# Patient Record
Sex: Female | Born: 1975 | Race: Black or African American | Hispanic: No | Marital: Single | Smoking: Former smoker
Health system: Southern US, Community
[De-identification: ages and names within clinical notes are randomized; demographics above are authoritative.]

## PROBLEM LIST (undated history)

## (undated) DIAGNOSIS — N939 Abnormal uterine and vaginal bleeding, unspecified: Secondary | ICD-10-CM

## (undated) DIAGNOSIS — D219 Benign neoplasm of connective and other soft tissue, unspecified: Secondary | ICD-10-CM

## (undated) DIAGNOSIS — R519 Headache, unspecified: Secondary | ICD-10-CM

## (undated) DIAGNOSIS — A64 Unspecified sexually transmitted disease: Secondary | ICD-10-CM

## (undated) DIAGNOSIS — R51 Headache: Secondary | ICD-10-CM

## (undated) DIAGNOSIS — K5190 Ulcerative colitis, unspecified, without complications: Secondary | ICD-10-CM

## (undated) DIAGNOSIS — I10 Essential (primary) hypertension: Secondary | ICD-10-CM

## (undated) DIAGNOSIS — D649 Anemia, unspecified: Secondary | ICD-10-CM

## (undated) HISTORY — PX: WISDOM TOOTH EXTRACTION: SHX21

## (undated) HISTORY — DX: Benign neoplasm of connective and other soft tissue, unspecified: D21.9

## (undated) HISTORY — DX: Abnormal uterine and vaginal bleeding, unspecified: N93.9

## (undated) HISTORY — DX: Essential (primary) hypertension: I10

## (undated) HISTORY — PX: ABDOMINAL HYSTERECTOMY: SHX81

## (undated) HISTORY — PX: TONSILLECTOMY: SUR1361

## (undated) HISTORY — DX: Unspecified sexually transmitted disease: A64

---

## 1997-12-14 ENCOUNTER — Emergency Department (HOSPITAL_COMMUNITY): Admission: EM | Admit: 1997-12-14 | Discharge: 1997-12-15 | Payer: Self-pay | Admitting: Family Medicine

## 1998-12-15 ENCOUNTER — Inpatient Hospital Stay (HOSPITAL_COMMUNITY): Admission: AD | Admit: 1998-12-15 | Discharge: 1998-12-15 | Payer: Self-pay | Admitting: *Deleted

## 1998-12-22 ENCOUNTER — Inpatient Hospital Stay (HOSPITAL_COMMUNITY): Admission: AD | Admit: 1998-12-22 | Discharge: 1998-12-22 | Payer: Self-pay | Admitting: *Deleted

## 1998-12-29 ENCOUNTER — Inpatient Hospital Stay (HOSPITAL_COMMUNITY): Admission: AD | Admit: 1998-12-29 | Discharge: 1998-12-29 | Payer: Self-pay | Admitting: *Deleted

## 1999-01-13 ENCOUNTER — Ambulatory Visit (HOSPITAL_COMMUNITY): Admission: RE | Admit: 1999-01-13 | Discharge: 1999-01-13 | Payer: Self-pay | Admitting: *Deleted

## 1999-01-13 ENCOUNTER — Encounter: Payer: Self-pay | Admitting: *Deleted

## 1999-04-04 ENCOUNTER — Inpatient Hospital Stay (HOSPITAL_COMMUNITY): Admission: AD | Admit: 1999-04-04 | Discharge: 1999-04-04 | Payer: Self-pay | Admitting: *Deleted

## 1999-05-24 ENCOUNTER — Inpatient Hospital Stay (HOSPITAL_COMMUNITY): Admission: AD | Admit: 1999-05-24 | Discharge: 1999-05-26 | Payer: Self-pay | Admitting: *Deleted

## 1999-09-10 ENCOUNTER — Emergency Department (HOSPITAL_COMMUNITY): Admission: EM | Admit: 1999-09-10 | Discharge: 1999-09-10 | Payer: Self-pay | Admitting: *Deleted

## 1999-10-14 ENCOUNTER — Inpatient Hospital Stay (HOSPITAL_COMMUNITY): Admission: AD | Admit: 1999-10-14 | Discharge: 1999-10-14 | Payer: Self-pay | Admitting: *Deleted

## 2001-12-20 ENCOUNTER — Emergency Department (HOSPITAL_COMMUNITY): Admission: EM | Admit: 2001-12-20 | Discharge: 2001-12-20 | Payer: Self-pay | Admitting: Emergency Medicine

## 2002-01-19 ENCOUNTER — Emergency Department (HOSPITAL_COMMUNITY): Admission: EM | Admit: 2002-01-19 | Discharge: 2002-01-20 | Payer: Self-pay | Admitting: Emergency Medicine

## 2002-09-24 ENCOUNTER — Encounter: Payer: Self-pay | Admitting: Emergency Medicine

## 2002-09-24 ENCOUNTER — Emergency Department (HOSPITAL_COMMUNITY): Admission: EM | Admit: 2002-09-24 | Discharge: 2002-09-24 | Payer: Self-pay | Admitting: Cardiology

## 2003-09-10 ENCOUNTER — Other Ambulatory Visit: Admission: RE | Admit: 2003-09-10 | Discharge: 2003-09-10 | Payer: Self-pay | Admitting: Obstetrics and Gynecology

## 2003-11-16 ENCOUNTER — Inpatient Hospital Stay (HOSPITAL_COMMUNITY): Admission: AD | Admit: 2003-11-16 | Discharge: 2003-11-16 | Payer: Self-pay | Admitting: Obstetrics and Gynecology

## 2004-03-13 ENCOUNTER — Inpatient Hospital Stay (HOSPITAL_COMMUNITY): Admission: AD | Admit: 2004-03-13 | Discharge: 2004-03-16 | Payer: Self-pay | Admitting: Obstetrics and Gynecology

## 2004-03-14 ENCOUNTER — Encounter (INDEPENDENT_AMBULATORY_CARE_PROVIDER_SITE_OTHER): Payer: Self-pay | Admitting: Specialist

## 2004-03-18 ENCOUNTER — Ambulatory Visit (HOSPITAL_COMMUNITY): Admission: RE | Admit: 2004-03-18 | Discharge: 2004-03-18 | Payer: Self-pay | Admitting: Pediatrics

## 2004-04-19 ENCOUNTER — Other Ambulatory Visit: Admission: RE | Admit: 2004-04-19 | Discharge: 2004-04-19 | Payer: Self-pay | Admitting: Obstetrics and Gynecology

## 2004-05-10 ENCOUNTER — Emergency Department (HOSPITAL_COMMUNITY): Admission: EM | Admit: 2004-05-10 | Discharge: 2004-05-10 | Payer: Self-pay | Admitting: Family Medicine

## 2004-11-21 ENCOUNTER — Inpatient Hospital Stay (HOSPITAL_COMMUNITY): Admission: AD | Admit: 2004-11-21 | Discharge: 2004-11-21 | Payer: Self-pay | Admitting: Obstetrics and Gynecology

## 2004-12-13 ENCOUNTER — Inpatient Hospital Stay (HOSPITAL_COMMUNITY): Admission: AD | Admit: 2004-12-13 | Discharge: 2004-12-13 | Payer: Self-pay | Admitting: Obstetrics & Gynecology

## 2005-02-01 ENCOUNTER — Inpatient Hospital Stay (HOSPITAL_COMMUNITY): Admission: AD | Admit: 2005-02-01 | Discharge: 2005-02-01 | Payer: Self-pay | Admitting: Obstetrics & Gynecology

## 2005-03-03 ENCOUNTER — Inpatient Hospital Stay (HOSPITAL_COMMUNITY): Admission: AD | Admit: 2005-03-03 | Discharge: 2005-03-03 | Payer: Self-pay | Admitting: Obstetrics & Gynecology

## 2005-03-05 ENCOUNTER — Inpatient Hospital Stay (HOSPITAL_COMMUNITY): Admission: RE | Admit: 2005-03-05 | Discharge: 2005-03-07 | Payer: Self-pay | Admitting: Obstetrics & Gynecology

## 2006-01-30 HISTORY — PX: TUBAL LIGATION: SHX77

## 2006-03-11 ENCOUNTER — Inpatient Hospital Stay (HOSPITAL_COMMUNITY): Admission: AD | Admit: 2006-03-11 | Discharge: 2006-03-11 | Payer: Self-pay | Admitting: Obstetrics and Gynecology

## 2006-04-10 ENCOUNTER — Inpatient Hospital Stay (HOSPITAL_COMMUNITY): Admission: AD | Admit: 2006-04-10 | Discharge: 2006-04-10 | Payer: Self-pay | Admitting: Obstetrics and Gynecology

## 2006-07-19 ENCOUNTER — Inpatient Hospital Stay (HOSPITAL_COMMUNITY): Admission: AD | Admit: 2006-07-19 | Discharge: 2006-07-19 | Payer: Self-pay | Admitting: Obstetrics and Gynecology

## 2006-08-07 ENCOUNTER — Inpatient Hospital Stay (HOSPITAL_COMMUNITY): Admission: AD | Admit: 2006-08-07 | Discharge: 2006-08-07 | Payer: Self-pay | Admitting: Obstetrics and Gynecology

## 2006-08-20 ENCOUNTER — Inpatient Hospital Stay (HOSPITAL_COMMUNITY): Admission: AD | Admit: 2006-08-20 | Discharge: 2006-08-23 | Payer: Self-pay | Admitting: Obstetrics and Gynecology

## 2006-08-21 ENCOUNTER — Encounter (INDEPENDENT_AMBULATORY_CARE_PROVIDER_SITE_OTHER): Payer: Self-pay | Admitting: Obstetrics and Gynecology

## 2006-11-10 ENCOUNTER — Inpatient Hospital Stay (HOSPITAL_COMMUNITY): Admission: AD | Admit: 2006-11-10 | Discharge: 2006-11-10 | Payer: Self-pay | Admitting: Radiology

## 2009-06-30 DIAGNOSIS — K5190 Ulcerative colitis, unspecified, without complications: Secondary | ICD-10-CM

## 2009-06-30 HISTORY — DX: Ulcerative colitis, unspecified, without complications: K51.90

## 2009-10-18 ENCOUNTER — Ambulatory Visit: Payer: Self-pay | Admitting: Nurse Practitioner

## 2009-10-18 ENCOUNTER — Inpatient Hospital Stay (HOSPITAL_COMMUNITY): Admission: AD | Admit: 2009-10-18 | Discharge: 2009-10-18 | Payer: Self-pay | Admitting: Obstetrics & Gynecology

## 2010-04-14 LAB — COMPREHENSIVE METABOLIC PANEL
AST: 13 U/L (ref 0–37)
Albumin: 3.3 g/dL — ABNORMAL LOW (ref 3.5–5.2)
Alkaline Phosphatase: 55 U/L (ref 39–117)
Chloride: 103 mEq/L (ref 96–112)
GFR calc Af Amer: >60 mL/min (ref >60)
Potassium: 3.7 mEq/L (ref 3.5–5.1)
Sodium: 138 mEq/L (ref 135–145)
Total Bilirubin: 0.6 mg/dL (ref 0.3–1.2)

## 2010-04-14 LAB — URINE MICROSCOPIC-ADD ON

## 2010-04-14 LAB — URINALYSIS, ROUTINE W REFLEX MICROSCOPIC
Bilirubin Urine: NEGATIVE
Ketones, ur: NEGATIVE mg/dL
Nitrite: NEGATIVE
pH: 6 (ref 5.0–8.0)

## 2010-04-14 LAB — GC/CHLAMYDIA PROBE AMP, GENITAL: Chlamydia, DNA Probe: NEGATIVE

## 2010-04-14 LAB — CBC
MCV: 88.4 fL (ref 78.0–100.0)
Platelets: 281 10*3/uL (ref 150–400)
RBC: 4.36 MIL/uL (ref 3.87–5.11)
WBC: 9.6 10*3/uL (ref 4.0–10.5)

## 2010-04-14 LAB — POCT PREGNANCY, URINE: Preg Test, Ur: NEGATIVE

## 2010-06-14 NOTE — Op Note (Signed)
NAMEBARBERA, PERRITT NO.:  [id number]   MEDICAL RECORD NO.:  [id number]          PATIENT TYPE:  INP   LOCATION:  9114                          FACILITY:  [facility]   PHYSICIAN:  Freda Munro, M.D.    DATE OF BIRTH:  [birth year]   DATE OF PROCEDURE:  08/21/2006  DATE OF DISCHARGE:                               OPERATIVE REPORT   PREOPERATIVE DIAGNOSIS:  Patient desires permanent sterilization.   POSTOPERATIVE DIAGNOSIS:  Patient desires permanent sterilization.   OPERATION PERFORMED:  Bilateral tubal ligation, Pomeroy procedure.   SURGEON:  Freda Munro, M.D.   ANESTHESIA:  Epidural.   ANTIBIOTICS:  Ancef 1 gram.   DRAINS:  Red rubber catheter to the bladder.   COMPLICATIONS:  None.   SPECIMENS:  Portion of right and left fallopian tube sent to pathology.   ESTIMATED BLOOD LOSS:  Minimal.   DESCRIPTION OF PROCEDURE:  The patient was taken to the operating room  where her epidural anesthetic was reinjected.  Once an adequate level  was reached, the patient was prepped with Betadine and her bladder  drained with a catheter.  The patient was then draped in the usual  fashion for this procedure.  A 2 cm infraumbilical incision was then  made.  This was carried down to fascia.  The fascia was entered in  midline, extended laterally with the Mayo scissors.  The parietal  peritoneum was entered sharply.  Education officer, museum were then placed.  The patient was then placed in Trendelenburg.  The right fallopian tube  was then grasped with a Babcock and carried to its fimbriated end for  identification.  A 2 cm knuckle in the isthmic portion was doubly  ligated with 0-0 gut suture.  The knuckle was excised, both ostia were  visualized.  Hemostasis appeared to be adequate.  Similar procedure was  performed on the opposite side.  At this point the parietal peritoneum  and fascia were closed using 0-0 Monocryl suture in a running fashion.  The skin was closed using 4-0  Vicryl in a subcuticular fashion  The  patient tolerated the procedure well.  She was taken to recovery room in  stable condition.  Instrument and lap counts correct x 1.           ______________________________  Freda Munro, M.D.     MA/MEDQ  D:  08/21/2006  T:  08/21/2006  Job:  [id number]

## 2010-06-17 NOTE — Discharge Summary (Signed)
Barbara, Poole              ACCOUNT NO.:  [id number]   MEDICAL RECORD NO.:  [id number]          PATIENT TYPE:  INP   LOCATION:  9120                          FACILITY:  [facility]   PHYSICIAN:  Gerda Diss. Zigmund Daniel, M.D.DATE OF BIRTH:  1975-07-19   DATE OF ADMISSION:  03/13/2004  DATE OF DISCHARGE:  03/16/2004                                 DISCHARGE SUMMARY   DISCHARGE DIAGNOSES:  1.  Intrauterine pregnancy at [redacted] weeks gestation, delivered.  2.  Genital herpes with no recent lesion.  3.  Term birth living child vertex.   PROCEDURE:  OB delivery.   CONSULTATIONS:  None.   DISCHARGE MEDICATIONS:  1.  Motrin 600 mg q.i.d. p.r.n.  2.  Percocet.  3.  Chromagen.   HISTORY OF PRESENT ILLNESS:  A 34 year old gravida 4, para 2-0-1-2, at [redacted]  weeks gestation.  Prenatal care was complicated by genital herpes and the  patient denied recent lesion.  She presented complaining of pressure.  She  had previously been 4 cm dilated in the office.  The patient was initially 4  cm in triage and changed over the brief observation period to 4 to 5 cm.  Hence, she was admitted for delivery.  For the remainder of history and  physical, please see chart.   HOSPITAL COURSE:  The patient was admitted to [facility].  Admission laboratory studies were drawn.  She went on to labor  and deliver on March 14, 2004, a 6 pound 5 ounce female with Apgars of 9 at  one minute and 9 at five minutes, sent to the newborn nursery.  Delivery was  accomplished per Jeneen Rinks A. Zigmund Daniel, M.D. over an intact perineum.  The  patient's postpartum course was benign save for the revelation of a positive  RPR and a positive PTPA.  The patient was notified of these findings as well  as the health department.  She was given Benzathine penicillin and  instructions to follow up with the health department for continued  surveillance as well as treatment of any partners.  She agreed to same.  She  was discharged  home on March 16, 2004, afebrile and in satisfactory  condition.  Hemoglobin and hematocrit on admission were 8.9 and 27.3,  respectively.  Repeat values obtained on March 15, 2004, were 8.4 and  25.7, respectively.   DISPOSITION:  The patient is to return to [facility]  in 4 to 6 weeks for postpartum evaluation.     JAM/MEDQ  D:  04/06/2004  T:  04/06/2004  Job:  [id number]

## 2010-06-17 NOTE — Discharge Summary (Signed)
NAME:  Barbara Poole, Barbara Poole NO.:  [id number]   MEDICAL RECORD NO.:  [id number]          PATIENT TYPE:  INP   LOCATION:  9141                          FACILITY:  [facility]   PHYSICIAN:  Cristopher Estimable. Stann Mainland, M.D.   DATE OF BIRTH:  August 13, 1975   DATE OF ADMISSION:  03/05/2005  DATE OF DISCHARGE:  03/07/2005                                 DISCHARGE SUMMARY   DISCHARGE DIAGNOSES:  1.  Term pregnancy, delivered; 5 pound 11 ounce female infant, Apgars 9/9.  2.  Blood type AB positive.  3.  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   PROCEDURES:  Normal spontaneous delivery.   SUMMARY:  This 35 year old gravida 62, para 3 with a due date of March 21, 2005 was admitted in labor at 37 weeks and 5 days gestation after an  uncomplicated pregnancy. She did have a positive RPR noted during her  antenatal course and reportedly was treated with intramuscular Bicillin on 2  occasions - once in February of 2006 and again in October of 2006. At the  time of admission, her cervix was 5-6 cm dilated, vertex -2 station. She  underwent amniotomy with a subsequent normal spontaneous delivery of a  viable female infant weighing 5 pounds 11 ounces. She delivered over an  intact perineum. Her postpartum course was benign. Her discharge hemoglobin  was 10.6 with a white count of 8300, platelet count of 232,000. Her RPR on  March 05, 2005 was 1:1.   The patient was breast-feeding at the time of discharge and having minimal  difficulty. She was given all appropriate instructions per the instruction  sheet and understood all instructions well at the time of discharge.   DISCHARGE MEDICATIONS:  1.  Vitamins - one a day.  2.  Ferrous sulfate 300 milligrams daily.  3.  Motrin 600 milligrams every 6 hours as needed for discomfort and      cramping.  4.  Tylox 1-2 q.4-6h. as needed for more severe pain.   FOLLOW UP:  She  will return the office follow-up in approximately four  weeks' time or as needed.   CONDITION ON DISCHARGE:  Improved.      Cristopher Estimable. Stann Mainland, M.D.  Electronically Signed     RMW/MEDQ  D:  03/07/2005  T:  03/07/2005  Job:  [id number]

## 2010-11-14 LAB — CBC
HCT: 33 — ABNORMAL LOW
HCT: 33.8 — ABNORMAL LOW
Hemoglobin: 10.9 — ABNORMAL LOW
MCV: 88.2
Platelets: 251
RBC: 3.84 — ABNORMAL LOW
WBC: 6.8
WBC: 7

## 2010-11-14 LAB — RPR TITER: RPR Titer: 1:1 {titer}

## 2010-11-15 LAB — URINALYSIS, ROUTINE W REFLEX MICROSCOPIC
Glucose, UA: NEGATIVE
Ketones, ur: NEGATIVE
Specific Gravity, Urine: 1.01
pH: 6

## 2010-11-15 LAB — URINE MICROSCOPIC-ADD ON

## 2010-11-16 LAB — URINALYSIS, ROUTINE W REFLEX MICROSCOPIC
Bilirubin Urine: NEGATIVE
Ketones, ur: NEGATIVE
Nitrite: NEGATIVE
Protein, ur: NEGATIVE
Urobilinogen, UA: 0.2
pH: 6

## 2010-12-20 ENCOUNTER — Inpatient Hospital Stay (HOSPITAL_COMMUNITY)
Admission: AD | Admit: 2010-12-20 | Discharge: 2010-12-20 | Disposition: A | Discharge status: Home / Self Care | Payer: Self-pay | Source: Ambulatory Visit | Attending: Family Medicine | Admitting: Family Medicine

## 2010-12-20 NOTE — ED Notes (Signed)
Called, not in lobby

## 2013-02-06 ENCOUNTER — Inpatient Hospital Stay (HOSPITAL_COMMUNITY): Payer: Self-pay

## 2013-02-06 ENCOUNTER — Inpatient Hospital Stay (HOSPITAL_COMMUNITY)
Admission: AD | Admit: 2013-02-06 | Discharge: 2013-02-06 | Disposition: A | Discharge status: Home / Self Care | Payer: Self-pay | Source: Ambulatory Visit | Attending: Obstetrics & Gynecology | Admitting: Obstetrics & Gynecology

## 2013-02-06 ENCOUNTER — Encounter (HOSPITAL_COMMUNITY): Payer: Self-pay | Admitting: General Practice

## 2013-02-06 DIAGNOSIS — R109 Unspecified abdominal pain: Secondary | ICD-10-CM | POA: Insufficient documentation

## 2013-02-06 DIAGNOSIS — N949 Unspecified condition associated with female genital organs and menstrual cycle: Secondary | ICD-10-CM | POA: Insufficient documentation

## 2013-02-06 DIAGNOSIS — D251 Intramural leiomyoma of uterus: Secondary | ICD-10-CM | POA: Insufficient documentation

## 2013-02-06 DIAGNOSIS — N920 Excessive and frequent menstruation with regular cycle: Secondary | ICD-10-CM | POA: Insufficient documentation

## 2013-02-06 DIAGNOSIS — N938 Other specified abnormal uterine and vaginal bleeding: Secondary | ICD-10-CM | POA: Insufficient documentation

## 2013-02-06 HISTORY — DX: Ulcerative colitis, unspecified, without complications: K51.90

## 2013-02-06 LAB — CBC
HEMATOCRIT: 36.8 % (ref 36.0–46.0)
HEMOGLOBIN: 11.9 g/dL — AB (ref 12.0–15.0)
MCH: 26.9 pg (ref 26.0–34.0)
MCHC: 32.3 g/dL (ref 30.0–36.0)
MCV: 83.1 fL (ref 78.0–100.0)
Platelets: 356 10*3/uL (ref 150–400)
RBC: 4.43 MIL/uL (ref 3.87–5.11)
RDW: 12.8 % (ref 11.5–15.5)
WBC: 6.1 10*3/uL (ref 4.0–10.5)

## 2013-02-06 LAB — WET PREP, GENITAL
CLUE CELLS WET PREP: NONE SEEN
TRICH WET PREP: NONE SEEN
WBC, Wet Prep HPF POC: NONE SEEN
YEAST WET PREP: NONE SEEN

## 2013-02-06 LAB — URINALYSIS, ROUTINE W REFLEX MICROSCOPIC
Bilirubin Urine: NEGATIVE
Glucose, UA: 250 mg/dL — AB
Ketones, ur: 40 mg/dL — AB
NITRITE: POSITIVE — AB
PH: 6.5 (ref 5.0–8.0)
Protein, ur: >300 mg/dL — AB
SPECIFIC GRAVITY, URINE: 1.02 (ref 1.005–1.030)
Urobilinogen, UA: 4 mg/dL — ABNORMAL HIGH (ref 0.0–1.0)

## 2013-02-06 LAB — POCT PREGNANCY, URINE: Preg Test, Ur: NEGATIVE

## 2013-02-06 LAB — URINE MICROSCOPIC-ADD ON

## 2013-02-06 MED ORDER — MEGESTROL ACETATE 40 MG PO TABS: 40.0000 mg | ORAL_TABLET | Freq: Every day | ORAL | Status: DC

## 2013-02-06 NOTE — Discharge Instructions (Signed)
Abnormal Uterine Bleeding Abnormal uterine bleeding can affect women at various stages in life, including teenagers, women in their reproductive years, pregnant women, and women who have reached menopause. Several kinds of uterine bleeding are considered abnormal, including:  Bleeding or spotting between periods.   Bleeding after sexual intercourse.   Bleeding that is heavier or more than normal.   Periods that last longer than usual.  Bleeding after menopause.  Many cases of abnormal uterine bleeding are minor and simple to treat, while others are more serious. Any type of abnormal bleeding should be evaluated by your health care provider. Treatment will depend on the cause of the bleeding. HOME CARE INSTRUCTIONS Monitor your condition for any changes. The following actions may help to alleviate any discomfort you are experiencing:  Avoid the use of tampons and douches as directed by your health care provider.  Change your pads frequently. You should get regular pelvic exams and Pap tests. Keep all follow-up appointments for diagnostic tests as directed by your health care provider.  SEEK MEDICAL CARE IF:   Your bleeding lasts more than 1 week.   You feel dizzy at times.  SEEK IMMEDIATE MEDICAL CARE IF:   You pass out.   You are changing pads every 15 to 30 minutes.   You have abdominal pain.  You have a fever.   You become sweaty or weak.   You are passing large blood clots from the vagina.   You start to feel nauseous and vomit. MAKE SURE YOU:   Understand these instructions.  Will watch your condition.  Will get help right away if you are not doing well or get worse. Document Released: 01/16/2005 Document Revised: 09/18/2012 Document Reviewed: 08/15/2012 [facility] Patient Information 2014 [place], [place].  Hypertension As your heart beats, it forces blood through your arteries. This force is your blood pressure. If the pressure is too high, it is  called hypertension (HTN) or high blood pressure. HTN is dangerous because you may have it and not know it. High blood pressure may mean that your heart has to work harder to pump blood. Your arteries may be narrow or stiff. The extra work puts you at risk for heart disease, stroke, and other problems.  Blood pressure consists of two numbers, a higher number over a lower, 110/72, for example. It is stated as "110 over 72." The ideal is below 120 for the top number (systolic) and under 80 for the bottom (diastolic). Write down your blood pressure today. You should pay close attention to your blood pressure if you have certain conditions such as:  Heart failure.  Prior heart attack.  Diabetes  Chronic kidney disease.  Prior stroke.  Multiple risk factors for heart disease. To see if you have HTN, your blood pressure should be measured while you are seated with your arm held at the level of the heart. It should be measured at least twice. A one-time elevated blood pressure reading (especially in the Emergency Department) does not mean that you need treatment. There may be conditions in which the blood pressure is different between your right and left arms. It is important to see your caregiver soon for a recheck. Most people have essential hypertension which means that there is not a specific cause. This type of high blood pressure may be lowered by changing lifestyle factors such as:  Stress.  Smoking.  Lack of exercise.  Excessive weight.  Drug/tobacco/alcohol use.  Eating less salt. Most people do not have symptoms from  high blood pressure until it has caused damage to the body. Effective treatment can often prevent, delay or reduce that damage. TREATMENT  When a cause has been identified, treatment for high blood pressure is directed at the cause. There are a large number of medications to treat HTN. These fall into several categories, and your caregiver will help you select the  medicines that are best for you. Medications may have side effects. You should review side effects with your caregiver. If your blood pressure stays high after you have made lifestyle changes or started on medicines,   Your medication(s) may need to be changed.  Other problems may need to be addressed.  Be certain you understand your prescriptions, and know how and when to take your medicine.  Be sure to follow up with your caregiver within the time frame advised (usually within two weeks) to have your blood pressure rechecked and to review your medications.  If you are taking more than one medicine to lower your blood pressure, make sure you know how and at what times they should be taken. Taking two medicines at the same time can result in blood pressure that is too low. SEEK IMMEDIATE MEDICAL CARE IF:  You develop a severe headache, blurred or changing vision, or confusion.  You have unusual weakness or numbness, or a faint feeling.  You have severe chest or abdominal pain, vomiting, or breathing problems. MAKE SURE YOU:   Understand these instructions.  Will watch your condition.  Will get help right away if you are not doing well or get worse. Document Released: 01/16/2005 Document Revised: 04/10/2011 Document Reviewed: 09/06/2007 [facility] Patient Information 2014 [place].

## 2013-02-06 NOTE — MAU Provider Note (Signed)
History     CSN: [id number]  Arrival date and time: 02/06/13 1024   None     Chief Complaint  Patient presents with  . Vaginal Bleeding   HPI RN note: Gynecology MAU Note Service date: 02/06/2013 11:14 AM   Pt presents to MAU with c/o heavy vaginal bleeding with golf ball sized clots since Monday January 5th. Pt states she had a period that ended on the 27th of December and started back on the 5th of January and she started passing clots that day. Pt admits that she started taking some birth control pills that had been prescribed to her mother and had expired in 03/2011 and that she started one of the packs midway through the pack as it was not a full pack. Pt states that she continues to pass clots throughout the day each time she goes to the bathroom. Pt states she is having cramping as well in her back and abdomen. Pt denies any possibility of pregnancy as she has had her tubes ties and uses condoms during intercourse   See above note; pt has not taken anything for the cramping Pt is not pregnant  No past medical history on file.  No past surgical history on file.  No family history on file.  History  Substance Use Topics  . Smoking status: Not on file  . Smokeless tobacco: Not on file  . Alcohol Use: Not on file    Allergies: No Known Allergies  No prescriptions prior to admission    Review of Systems  Constitutional: Negative for fever and chills.  Gastrointestinal: Positive for abdominal pain. Negative for heartburn, nausea, vomiting, diarrhea and constipation.  Genitourinary: Negative for dysuria and urgency.  Musculoskeletal: Positive for back pain.  Skin: Negative for rash.   Physical Exam   Blood pressure 163/108, pulse 89, temperature 98.6 F (37 C), temperature source Oral, resp. rate 18, height 5' 4"  (1.626 m), weight 68.13 kg (150 lb 3.2 oz), last menstrual period 02/03/2013.  Physical Exam  Nursing note and vitals reviewed. Constitutional: She is  oriented to person, place, and time. She appears well-developed and well-nourished. No distress.  HENT:  Head: Normocephalic.  Eyes: Pupils are equal, round, and reactive to light.  Neck: Normal range of motion. Neck supple.  Cardiovascular:  hypertensive  Respiratory: Effort normal.  GI: Soft. She exhibits no distension. There is no tenderness. There is no rebound and no guarding.  Genitourinary:  Small amount of bright red blood in vault; cervix clean, NT; bimanual uterus NSSC NT; adnexa without palpable enlargement or tenderness  Musculoskeletal: Normal range of motion.  Neurological: She is alert and oriented to person, place, and time.  Skin: Skin is warm and dry.  Psychiatric: She has a normal mood and affect.    MAU Course  Procedures Results for orders placed during the hospital encounter of 02/06/13 (from the past 24 hour(s))  URINALYSIS, ROUTINE W REFLEX MICROSCOPIC     Status: Abnormal   Collection Time    02/06/13 10:15 AM      Result Value Range   Color, Urine RED (*) YELLOW   APPearance CLOUDY (*) CLEAR   Specific Gravity, Urine 1.020  1.005 - 1.030   pH 6.5  5.0 - 8.0   Glucose, UA 250 (*) NEGATIVE mg/dL   Hgb urine dipstick LARGE (*) NEGATIVE   Bilirubin Urine NEGATIVE  NEGATIVE   Ketones, ur 40 (*) NEGATIVE mg/dL   Protein, ur >300 (*) NEGATIVE mg/dL   Urobilinogen,  UA 4.0 (*) 0.0 - 1.0 mg/dL   Nitrite POSITIVE (*) NEGATIVE   Leukocytes, UA MODERATE (*) NEGATIVE  URINE MICROSCOPIC-ADD ON     Status: None   Collection Time    02/06/13 10:15 AM      Result Value Range   Urine-Other FIELD OBSCURED BY RBC'S    POCT PREGNANCY, URINE     Status: None   Collection Time    02/06/13 11:28 AM      Result Value Range   Preg Test, Ur NEGATIVE  NEGATIVE  CBC     Status: Abnormal   Collection Time    02/06/13 11:41 AM      Result Value Range   WBC 6.1  4.0 - 10.5 K/uL   RBC 4.43  3.87 - 5.11 MIL/uL   Hemoglobin 11.9 (*) 12.0 - 15.0 g/dL   HCT 36.8  36.0 - 46.0  %   MCV 83.1  78.0 - 100.0 fL   MCH 26.9  26.0 - 34.0 pg   MCHC 32.3  30.0 - 36.0 g/dL   RDW 12.8  11.5 - 15.5 %   Platelets 356  150 - 400 K/uL  WET PREP, GENITAL     Status: None   Collection Time    02/06/13 11:53 AM      Result Value Range   Yeast Wet Prep HPF POC NONE SEEN  NONE SEEN   Trich, Wet Prep NONE SEEN  NONE SEEN   Clue Cells Wet Prep HPF POC NONE SEEN  NONE SEEN   WBC, Wet Prep HPF POC NONE SEEN  NONE SEEN  car handed over to [employer], CNM  US Transvaginal Non-ob  02/06/2013   CLINICAL DATA:  Dysfunctional uterine bleeding. Low back pain. LMP 01/20/2013  EXAM: TRANSABDOMINAL AND TRANSVAGINAL ULTRASOUND OF PELVIS  TECHNIQUE: Both transabdominal and transvaginal ultrasound examinations of the pelvis were performed. Transabdominal technique was performed for global imaging of the pelvis including uterus, ovaries, adnexal regions, and pelvic cul-de-sac. It was necessary to proceed with endovaginal exam following the transabdominal exam to visualize the endometrium and ovaries.  COMPARISON:  10/18/09  FINDINGS: Uterus  Measurements: 9.1 x 4.6 x 5.8 cm. Multiple small intramural fibroids are seen numbering at least 4-5, which all measure 1 cm in diameter or less.  Endometrium  Thickness: 6 mm.  No focal abnormality visualized.  Right ovary  Measurements: 3.1 x 1.5 x 1.7 cm. Normal appearance/no adnexal mass.  Left ovary  Measurements: 2.7 x 1.8 x 2.6 cm. Normal appearance/no adnexal mass.  Other findings  No free fluid.  IMPRESSION: Multiple small approximately 1 cm uterine fibroids.  Endometrial thickness measures 6 mm. If bleeding remains unresponsive to hormonal or medical therapy, sonohysterogram should be considered for focal lesion work-up. (Ref: Radiological Reasoning: Algorithmic Workup of Abnormal Vaginal Bleeding with Endovaginal Sonography and Sonohysterography. AJR 2008; 240:X73-53).  Normal appearance of both ovaries.   Electronically Signed   By: Earle Gell M.D.   On:  02/06/2013 13:15   US Pelvis Complete  02/06/2013   CLINICAL DATA:  Dysfunctional uterine bleeding. Low back pain. LMP 01/20/2013  EXAM: TRANSABDOMINAL AND TRANSVAGINAL ULTRASOUND OF PELVIS  TECHNIQUE: Both transabdominal and transvaginal ultrasound examinations of the pelvis were performed. Transabdominal technique was performed for global imaging of the pelvis including uterus, ovaries, adnexal regions, and pelvic cul-de-sac. It was necessary to proceed with endovaginal exam following the transabdominal exam to visualize the endometrium and ovaries.  COMPARISON:  10/18/09  FINDINGS: Uterus  Measurements:  9.1 x 4.6 x 5.8 cm. Multiple small intramural fibroids are seen numbering at least 4-5, which all measure 1 cm in diameter or less.  Endometrium  Thickness: 6 mm.  No focal abnormality visualized.  Right ovary  Measurements: 3.1 x 1.5 x 1.7 cm. Normal appearance/no adnexal mass.  Left ovary  Measurements: 2.7 x 1.8 x 2.6 cm. Normal appearance/no adnexal mass.  Other findings  No free fluid.  IMPRESSION: Multiple small approximately 1 cm uterine fibroids.  Endometrial thickness measures 6 mm. If bleeding remains unresponsive to hormonal or medical therapy, sonohysterogram should be considered for focal lesion work-up. (Ref: Radiological Reasoning: Algorithmic Workup of Abnormal Vaginal Bleeding with Endovaginal Sonography and Sonohysterography. AJR 2008; 332:R51-88).  Normal appearance of both ovaries.   Electronically Signed   By: Earle Gell M.D.   On: 02/06/2013 13:15     Assessment and Plan   1. Other disorder of menstruation and other abnormal bleeding from female genital tract   2. Menorrhagia    FU in the clinic for bleeding FU with [facility] for hypertension Return to ER for any danger signs related to hypertension     Medication List         aspirin 81 MG tablet  Take 81 mg by mouth daily.     CALCIUM PO  Take 1 tablet by mouth daily.     IRON PO  Take 1 tablet by  mouth daily.     megestrol 40 MG tablet  Commonly known as:  MEGACE  Take 1 tablet (40 mg total) by mouth daily. Take 3 PO for 3 days. Then 2 PO for 2 days. Then 1 PO for up to one week as needed for bleeding     multivitamin with minerals Tabs tablet  Take 1 tablet by mouth daily.       Follow-up Information   Schedule an appointment as soon as possible for a visit with [place]    .   Contact information:   [place] [phone] [phone]      LINEBERRY,SUSAN 02/06/2013, 11:25 AM   [facility]

## 2013-02-06 NOTE — MAU Provider Note (Signed)
Attestation of Attending Supervision of Advanced Practitioner (CNM/NP): Evaluation and management procedures were performed by the Advanced Practitioner under my supervision and collaboration.  I have reviewed the Advanced Practitioner's note and chart, and I agree with the management and plan.  HARRAWAY-SMITH, Shoua Ulloa 3:44 PM

## 2013-02-06 NOTE — MAU Note (Signed)
Pt reports she had a period end of December. Started bleeing again on Monday. Stated she took a full pack of her mother birth control pills last month. And stared  A pack (partial pack)  (midle of second week in pack).

## 2013-02-06 NOTE — MAU Note (Signed)
Pt presents to MAU with c/o heavy vaginal bleeding with golf ball sized clots since Monday January 5th. Pt states she had a period that ended on the 27th of December and started back on the 5th of January and she started passing clots that day. Pt admits that she started taking some birth control pills that had been prescribed to her mother and had expired in 03/2011 and that she started one of the packs midway through the pack as it was not a full pack. Pt states that she continues to pass clots throughout the day each time she goes to the bathroom. Pt states she is having cramping as well in her back and abdomen. Pt denies any possibility of pregnancy as she has had her tubes ties and uses condoms during intercourse.

## 2013-02-07 LAB — GC/CHLAMYDIA PROBE AMP
CT Probe RNA: NEGATIVE
GC Probe RNA: NEGATIVE

## 2013-02-17 ENCOUNTER — Encounter: Payer: Self-pay | Admitting: Obstetrics and Gynecology

## 2013-03-14 ENCOUNTER — Ambulatory Visit: Payer: Self-pay | Admitting: Internal Medicine

## 2013-03-19 ENCOUNTER — Encounter: Payer: Self-pay | Admitting: Obstetrics and Gynecology

## 2013-12-01 ENCOUNTER — Encounter (HOSPITAL_COMMUNITY): Payer: Self-pay | Admitting: General Practice

## 2014-01-07 DIAGNOSIS — I10 Essential (primary) hypertension: Secondary | ICD-10-CM | POA: Insufficient documentation

## 2015-03-16 ENCOUNTER — Telehealth: Payer: Self-pay

## 2015-03-16 NOTE — Telephone Encounter (Signed)
CALLED PATIENT AND ASKED HER TO CALL TO Barbara Poole NEW PATIENT APPT

## 2015-03-31 ENCOUNTER — Encounter: Payer: Self-pay | Admitting: Obstetrics

## 2015-03-31 ENCOUNTER — Ambulatory Visit (INDEPENDENT_AMBULATORY_CARE_PROVIDER_SITE_OTHER): Payer: BLUE CROSS/BLUE SHIELD | Admitting: Obstetrics

## 2015-03-31 VITALS — BP 127/89 | HR 86 | Wt 144.0 lb

## 2015-03-31 DIAGNOSIS — Z01419 Encounter for gynecological examination (general) (routine) without abnormal findings: Secondary | ICD-10-CM

## 2015-03-31 DIAGNOSIS — N939 Abnormal uterine and vaginal bleeding, unspecified: Secondary | ICD-10-CM

## 2015-03-31 NOTE — Progress Notes (Signed)
Subjective:        Barbara Poole is a 40 y.o. female here for a routine exam.  Current complaints: Irregular periods, sometimes prolonged.    Personal health questionnaire:  Is patient Ashkenazi Jewish, have a family history of breast and/or ovarian cancer: no Is there a family history of uterine cancer diagnosed at age < 22, gastrointestinal cancer, urinary tract cancer, family member who is a Field seismologist syndrome-associated carrier: no Is the patient overweight and hypertensive, family history of diabetes, personal history of gestational diabetes, preeclampsia or PCOS: no Is patient over 1, have PCOS,  family history of premature CHD under age 42, diabetes, smoke, have hypertension or peripheral artery disease:  no At any time, has a partner hit, kicked or otherwise hurt or frightened you?: no Over the past 2 weeks, have you felt down, depressed or hopeless?: no Over the past 2 weeks, have you felt little interest or pleasure in doing things?:no   Gynecologic History Patient's last menstrual period was 02/26/2015. Contraception: tubal ligation Last Pap: 3 yrs ago. Results were: normal Last mammogram: n/a. Results were: n/a  Obstetric History OB History  Gravida Para Term Preterm AB SAB TAB Ectopic Multiple Living  6 5 5  1     3     # Outcome Date GA Lbr Len/2nd Weight Sex Delivery Anes PTL Lv  6 Term 08/21/06    F Vag-Spont     5 Term 03/05/05    F Vag-Spont   Y  4 Term 03/14/04    [address]   Y  3 Term 05/24/99    M Vag-Spont     2 AB 11/1997          1 Term 03/26/92    [place]      Past Medical History  Diagnosis Date  . Ulcerative colitis ([place]) 06/2009    does not take meds    Past Surgical History  Procedure Laterality Date  . Tonsillectomy    . Tubal ligation       Current outpatient prescriptions:  .  chlorthalidone (HYGROTON) 25 MG tablet, Take 25 mg by mouth daily., Disp: , Rfl:  .  FLUoxetine (PROZAC) 20 MG capsule, Take 20 mg by mouth daily.,  Disp: , Rfl:  .  IRON PO, Take 1 tablet by mouth daily., Disp: , Rfl:  .  minocycline (DYNACIN) 50 MG tablet, Take 50 mg by mouth 2 (two) times daily., Disp: , Rfl:  .  Multiple Vitamin (MULTIVITAMIN WITH MINERALS) TABS tablet, Take 1 tablet by mouth daily., Disp: , Rfl:  .  valACYclovir (VALTREX) 500 MG tablet, Take 500 mg by mouth 2 (two) times daily., Disp: , Rfl:  .  aspirin 81 MG tablet, Take 81 mg by mouth daily. Reported on 03/31/2015, Disp: , Rfl:  .  CALCIUM PO, Take 1 tablet by mouth daily. Reported on 03/31/2015, Disp: , Rfl:  No Known Allergies  Social History  Substance Use Topics  . Smoking status: Former Smoker    Quit date: 03/10/2003  . Smokeless tobacco: Not on file  . Alcohol Use: 0.0 oz/week    0 Standard drinks or equivalent per week     Comment: once a week    Family History  Problem Relation Age of Onset  . Hyperlipidemia Father   . Hypertension Father   . Crohn's disease Son   . Diabetes Maternal Uncle   . Diabetes Paternal Aunt   . Hypertension Maternal Grandmother   .  Diabetes Paternal Grandmother       Review of Systems  Constitutional: negative for fatigue and weight loss Respiratory: negative for cough and wheezing Cardiovascular: negative for chest pain, fatigue and palpitations Gastrointestinal: negative for abdominal pain and change in bowel habits Musculoskeletal:negative for myalgias Neurological: negative for gait problems and tremors Behavioral/Psych: negative for abusive relationship, depression Endocrine: negative for temperature intolerance   Genitourinary: positive for abnormal menstrual periods.  Negative for genital lesions, hot flashes, sexual problems and vaginal discharge Integument/breast: negative for breast lump, breast tenderness, nipple discharge and skin lesion(s)    Objective:       BP 127/89 mmHg  Pulse 86  Wt 144 lb (65.318 kg)  LMP 02/26/2015 General:   alert  Skin:   no rash or abnormalities  Lungs:   clear to  auscultation bilaterally  Heart:   regular rate and rhythm, S1, S2 normal, no murmur, click, rub or gallop  Breasts:   normal without suspicious masses, skin or nipple changes or axillary nodes  Abdomen:  normal findings: no organomegaly, soft, non-tender and no hernia  Pelvis:  External genitalia: normal general appearance Urinary system: urethral meatus normal and bladder without fullness, nontender Vaginal: normal without tenderness, induration or masses Cervix: normal appearance Adnexa: normal bimanual exam Uterus: anteverted and non-tender, normal size   Lab Review Urine pregnancy test Labs reviewed yes Radiologic studies reviewed yes    Assessment:    Healthy female exam.    Contraception: Tubal Ligation  AUB.  Last ultrasound remarkable only for small intramural fibroids.  Periods normal before being placed on Micronor.    Plan:    Wet prep and cultures ordered  D/C Micronor.  No hormonal intervention with cycles.  F/U in 3 months   Education reviewed: calcium supplements, low fat, low cholesterol diet, self breast exams and weight bearing exercise.   Meds ordered this encounter  Medications  . valACYclovir (VALTREX) 500 MG tablet    Sig: Take 500 mg by mouth 2 (two) times daily.  . minocycline (DYNACIN) 50 MG tablet    Sig: Take 50 mg by mouth 2 (two) times daily.  . chlorthalidone (HYGROTON) 25 MG tablet    Sig: Take 25 mg by mouth daily.  Marland Kitchen FLUoxetine (PROZAC) 20 MG capsule    Sig: Take 20 mg by mouth daily.   Orders Placed This Encounter  Procedures  . SureSwab, Vaginosis/Vaginitis Plus

## 2015-04-03 LAB — PAP, TP IMAGING W/ HPV RNA, RFLX HPV TYPE 16,18/45: HPV mRNA, High Risk: NOT DETECTED

## 2015-04-04 LAB — SURESWAB, VAGINOSIS/VAGINITIS PLUS
Atopobium vaginae: 6.4 Log (cells/mL)
BV CATEGORY: UNDETERMINED — AB
C. albicans, DNA: NOT DETECTED
C. glabrata, DNA: NOT DETECTED
C. parapsilosis, DNA: NOT DETECTED
C. trachomatis RNA, TMA: NOT DETECTED
C. tropicalis, DNA: NOT DETECTED
GARDNERELLA VAGINALIS: 7.4 Log (cells/mL)
LACTOBACILLUS SPECIES: DETECTED Log (cells/mL)
MEGASPHAERA SPECIES: 7.1 Log (cells/mL)
N. gonorrhoeae RNA, TMA: NOT DETECTED
T. vaginalis RNA, QL TMA: NOT DETECTED

## 2015-04-05 ENCOUNTER — Other Ambulatory Visit: Payer: Self-pay | Admitting: Obstetrics

## 2015-04-05 DIAGNOSIS — B9689 Other specified bacterial agents as the cause of diseases classified elsewhere: Secondary | ICD-10-CM

## 2015-04-05 DIAGNOSIS — N760 Acute vaginitis: Principal | ICD-10-CM

## 2015-04-05 MED ORDER — TINIDAZOLE 500 MG PO TABS: 1000.0000 mg | ORAL_TABLET | Freq: Every day | ORAL | Status: DC

## 2015-04-16 ENCOUNTER — Encounter: Payer: Self-pay | Admitting: *Deleted

## 2015-04-30 ENCOUNTER — Telehealth: Payer: Self-pay | Admitting: *Deleted

## 2015-04-30 NOTE — Telephone Encounter (Signed)
Pt called office regarding labs.  Return call to pt. Pt states she received letter in mail with results.  Pt was made aware that Dr Jodi Mourning has sent a Rx to her pharmacy. Pt advised to f/u at pharmacy for Rx. If any problems, pt to call office.

## 2015-06-14 ENCOUNTER — Other Ambulatory Visit: Payer: Self-pay | Admitting: Certified Nurse Midwife

## 2015-07-01 ENCOUNTER — Ambulatory Visit: Payer: BLUE CROSS/BLUE SHIELD | Admitting: Obstetrics

## 2015-11-16 ENCOUNTER — Other Ambulatory Visit: Payer: Self-pay | Admitting: Nurse Practitioner

## 2015-11-16 DIAGNOSIS — Z1231 Encounter for screening mammogram for malignant neoplasm of breast: Secondary | ICD-10-CM

## 2016-04-11 ENCOUNTER — Telehealth: Payer: Self-pay | Admitting: Obstetrics & Gynecology

## 2016-04-11 NOTE — Telephone Encounter (Signed)
Called and left a message for patient to call back to schedule a new patient doctor referral. °

## 2016-04-12 NOTE — Telephone Encounter (Signed)
Called and left a message for patient to call back to schedule a new patient doctor referral. °

## 2016-04-13 NOTE — Telephone Encounter (Signed)
Called and left a message for patient to call back to schedule a new patient doctor referral. °

## 2016-04-17 NOTE — Telephone Encounter (Signed)
Routing referral back to referring office as patient has not returned multiple calls to schedule an appointment.

## 2016-05-01 ENCOUNTER — Ambulatory Visit (INDEPENDENT_AMBULATORY_CARE_PROVIDER_SITE_OTHER): Payer: BLUE CROSS/BLUE SHIELD | Admitting: Obstetrics & Gynecology

## 2016-05-01 ENCOUNTER — Encounter: Payer: Self-pay | Admitting: Obstetrics & Gynecology

## 2016-05-01 VITALS — BP 130/84 | HR 80 | Resp 16 | Ht 65.0 in | Wt 153.0 lb

## 2016-05-01 DIAGNOSIS — N921 Excessive and frequent menstruation with irregular cycle: Secondary | ICD-10-CM

## 2016-05-01 DIAGNOSIS — D500 Iron deficiency anemia secondary to blood loss (chronic): Secondary | ICD-10-CM

## 2016-05-01 DIAGNOSIS — D251 Intramural leiomyoma of uterus: Secondary | ICD-10-CM | POA: Diagnosis not present

## 2016-05-01 MED ORDER — VALACYCLOVIR HCL 500 MG PO TABS: 500.0000 mg | ORAL_TABLET | Freq: Every day | ORAL | Status: DC

## 2016-05-01 MED ORDER — MISOPROSTOL 200 MCG PO TABS: ORAL_TABLET | ORAL | 0 refills | Status: DC

## 2016-05-02 ENCOUNTER — Encounter: Payer: Self-pay | Admitting: Obstetrics & Gynecology

## 2016-05-02 DIAGNOSIS — D251 Intramural leiomyoma of uterus: Secondary | ICD-10-CM | POA: Insufficient documentation

## 2016-05-02 DIAGNOSIS — D500 Iron deficiency anemia secondary to blood loss (chronic): Secondary | ICD-10-CM | POA: Insufficient documentation

## 2016-05-02 DIAGNOSIS — N921 Excessive and frequent menstruation with irregular cycle: Secondary | ICD-10-CM | POA: Insufficient documentation

## 2016-05-02 NOTE — Progress Notes (Signed)
41 y.o. Barbara Poole Single African AmericanF here for new patient visit to discuss options for treatment of heavy bleeding.  Pt reports cycles have been getting worse over the past few years.  Now cycles are heavy with passing clots and cramping.  This has gotten so significant that she was started on micronor by Rockney Ghee, her PCP.  Pt reports cycles have improved with the micronor, however they are irregular and not predictable.  She is on the second pack of micronor.  Pt had an ultrasound done 2015 for bleeding and back pain issues.  this showed her uterus to measure 9.1 x 4.6 x 5.8cm with several small fibroids.  She had a repeat ultrasound 04/06/16 showing the uterus to now measure 10 x 5.2 x 6.3cm.  Again, several small fibroids were noted.  Endometrium was 74m.  Ovaries appeared normal.  Her most recent hemoglobin was 8.8 with elevated platelet count and low iron/ferritin levels.  These labs were obtained 2/27 and 3/8 of this year.  Patient's last menstrual period was 04/30/2016.          Sexually active: Yes.    The current method of family planning is tubal ligation.    Exercising: Yes.    walking Smoker:  no  Health Maintenance: Pap:  03/31/15 neg with neg HR HPV History of abnormal Pap:  no MMG:  Not started Colonoscopy:  n/a BMD:   n/a TDaP:  02/19/09 Pneumonia vaccine(s):  n/a Zostavax:   n/a Hep C testing: not indicated Screening Labs: with PCP with hb 8.8   reports that she quit smoking about 13 years ago. She has never used smokeless tobacco. She reports that she drinks about 0.6 oz of alcohol per week . She reports that she does not use drugs.  Past Medical History:  Diagnosis Date  . Abnormal uterine bleeding   . Fibroid   . Hypertension   . STD (sexually transmitted disease)    HSV-Age 79  . Ulcerative colitis [place] 06/2009   does not take meds    Past Surgical History:  Procedure Laterality Date  . TONSILLECTOMY    . TUBAL LIGATION  2008   post partum  . WISDOM  TOOTH EXTRACTION      Meds:  Reviewed in EPIC and UTD  Family History  Problem Relation Age of Onset  . Hyperlipidemia Father   . Hypertension Father   . Crohn's disease Son   . Diabetes Maternal Uncle   . Diabetes Paternal Aunt   . Hypertension Maternal Grandmother   . Diabetes Paternal Grandmother     ROS:  Pertinent items are noted in HPI.  Otherwise, a comprehensive ROS was negative.  Exam:   BP 130/84 (BP Location: Right Arm, Patient Position: Sitting, Cuff Size: Normal)   Pulse 80   Resp 16   Ht 5' 5"  (1.651 m)   Wt 153 lb (69.4 kg)   LMP 04/30/2016   BMI 25.46 kg/m    Height: 5' 5"  (165.1 cm)  Ht Readings from Last 3 Encounters:  05/01/16 5' 5"  (1.651 m)  02/06/13 5' 4"  (1.626 m)    General appearance: alert, cooperative and appears stated age Head: Normocephalic, without obvious abnormality, atraumatic Neck: no adenopathy, supple, symmetrical, trachea midline and thyroid normal to inspection and palpation Lungs: clear to auscultation bilaterally Heart: regular rate and rhythm Abdomen: soft, non-tender; bowel sounds normal; no masses,  no organomegaly Extremities: extremities normal, atraumatic, no cyanosis or edema Skin: Skin color, texture, turgor normal. No rashes  or lesions Lymph nodes: Cervical, supraclavicular, and axillary nodes normal. No abnormal inguinal nodes palpated Neurologic: Grossly normal   Pelvic: Declines pelvic exam today as she is bleeding heavily.    A:  Menorrhagia Iron deficiency anemia Multiple small uterine fibroids Hypertension h/o HSV, on suppressive therapy Ulcerative colitis, off medication at this time due to cost (encouraged pt to contact Dr. Ulyses Amor office so he is aware and can possibly change medication for pt)  P:   Disucssed with pt treatment options for bleeding including contraceptive pills (she is on micronor and should not use combination OCPs due to hypertension hx), Mirena IUD, Endometrial ablation, [place], and  hysterectomy.  She specifically has questions about myomectomy which is not a good option for her due to the multiple but small fibroids.  She is most interested in an IUD.  Information about all of the above given. Will precert Mirena IUD and plan placement.  Will obtain GC/Chl testing and consider endometrial biopsy the same day as well.  rx for cytotec and instructions for day of placement given as well today. Iron encouraged.  Consider referral to hematology for iron infusions.  ~40 minutes spent with patient >50% of time was in face to face discussion of above.

## 2016-05-04 ENCOUNTER — Telehealth: Payer: Self-pay | Admitting: Obstetrics & Gynecology

## 2016-05-04 NOTE — Telephone Encounter (Signed)
Called patient to review benefits for a recommended Mirena IUD. Left Voicemail requesting a call back.

## 2016-05-06 ENCOUNTER — Encounter: Payer: Self-pay | Admitting: Obstetrics & Gynecology

## 2016-06-02 ENCOUNTER — Emergency Department (HOSPITAL_COMMUNITY)
Admission: EM | Admit: 2016-06-02 | Discharge: 2016-06-02 | Disposition: A | Discharge status: Home / Self Care | Payer: BLUE CROSS/BLUE SHIELD | Attending: Emergency Medicine | Admitting: Emergency Medicine

## 2016-06-02 ENCOUNTER — Emergency Department (HOSPITAL_COMMUNITY): Payer: BLUE CROSS/BLUE SHIELD

## 2016-06-02 ENCOUNTER — Encounter (HOSPITAL_COMMUNITY): Payer: Self-pay | Admitting: Emergency Medicine

## 2016-06-02 DIAGNOSIS — M545 Low back pain: Secondary | ICD-10-CM | POA: Insufficient documentation

## 2016-06-02 DIAGNOSIS — S8002XA Contusion of left knee, initial encounter: Secondary | ICD-10-CM | POA: Diagnosis not present

## 2016-06-02 DIAGNOSIS — Y999 Unspecified external cause status: Secondary | ICD-10-CM | POA: Insufficient documentation

## 2016-06-02 DIAGNOSIS — I10 Essential (primary) hypertension: Secondary | ICD-10-CM | POA: Diagnosis not present

## 2016-06-02 DIAGNOSIS — M25572 Pain in left ankle and joints of left foot: Secondary | ICD-10-CM | POA: Diagnosis not present

## 2016-06-02 DIAGNOSIS — Z7982 Long term (current) use of aspirin: Secondary | ICD-10-CM | POA: Diagnosis not present

## 2016-06-02 DIAGNOSIS — Y939 Activity, unspecified: Secondary | ICD-10-CM | POA: Diagnosis not present

## 2016-06-02 DIAGNOSIS — Y92410 Unspecified street and highway as the place of occurrence of the external cause: Secondary | ICD-10-CM | POA: Insufficient documentation

## 2016-06-02 DIAGNOSIS — Z87891 Personal history of nicotine dependence: Secondary | ICD-10-CM | POA: Insufficient documentation

## 2016-06-02 DIAGNOSIS — S8992XA Unspecified injury of left lower leg, initial encounter: Secondary | ICD-10-CM | POA: Diagnosis present

## 2016-06-02 MED ORDER — CYCLOBENZAPRINE HCL 10 MG PO TABS: 10.0000 mg | ORAL_TABLET | Freq: Three times a day (TID) | ORAL | 0 refills | Status: DC | PRN

## 2016-06-02 MED ORDER — IBUPROFEN 800 MG PO TABS: 800.0000 mg | ORAL_TABLET | Freq: Three times a day (TID) | ORAL | 0 refills | Status: DC | PRN

## 2016-06-02 MED ORDER — IBUPROFEN 200 MG PO TABS
600.0000 mg | ORAL_TABLET | Freq: Once | ORAL | Status: AC
  Administered 2016-06-02: 600 mg via ORAL
  Filled 2016-06-02: qty 3
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
## 2016-06-02 NOTE — Discharge Instructions (Signed)
Read the information below.  Use the prescribed medication as directed.  Please discuss all new medications with your pharmacist.  You may return to the Emergency Department at any time for worsening condition or any new symptoms that concern you.     If you develop uncontrolled pain, weakness or numbness of the extremity, severe discoloration of the skin, or you are unable to walk or move your knee, return to the ER for a recheck.
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
## 2016-08-15 ENCOUNTER — Emergency Department (HOSPITAL_COMMUNITY): Payer: BLUE CROSS/BLUE SHIELD

## 2016-08-15 ENCOUNTER — Encounter (HOSPITAL_COMMUNITY): Payer: Self-pay | Admitting: Emergency Medicine

## 2016-08-15 ENCOUNTER — Emergency Department (HOSPITAL_COMMUNITY)
Admission: EM | Admit: 2016-08-15 | Discharge: 2016-08-15 | Disposition: A | Discharge status: Home / Self Care | Payer: BLUE CROSS/BLUE SHIELD | Attending: Emergency Medicine | Admitting: Emergency Medicine

## 2016-08-15 DIAGNOSIS — I10 Essential (primary) hypertension: Secondary | ICD-10-CM | POA: Insufficient documentation

## 2016-08-15 DIAGNOSIS — M25511 Pain in right shoulder: Secondary | ICD-10-CM

## 2016-08-15 DIAGNOSIS — M549 Dorsalgia, unspecified: Secondary | ICD-10-CM

## 2016-08-15 DIAGNOSIS — Z87891 Personal history of nicotine dependence: Secondary | ICD-10-CM | POA: Insufficient documentation

## 2016-08-15 DIAGNOSIS — M542 Cervicalgia: Secondary | ICD-10-CM | POA: Diagnosis not present

## 2016-08-15 DIAGNOSIS — W19XXXA Unspecified fall, initial encounter: Secondary | ICD-10-CM | POA: Insufficient documentation

## 2016-08-15 DIAGNOSIS — Y999 Unspecified external cause status: Secondary | ICD-10-CM | POA: Diagnosis not present

## 2016-08-15 DIAGNOSIS — S46811A Strain of other muscles, fascia and tendons at shoulder and upper arm level, right arm, initial encounter: Secondary | ICD-10-CM

## 2016-08-15 DIAGNOSIS — Z79899 Other long term (current) drug therapy: Secondary | ICD-10-CM | POA: Insufficient documentation

## 2016-08-15 DIAGNOSIS — Z7982 Long term (current) use of aspirin: Secondary | ICD-10-CM | POA: Insufficient documentation

## 2016-08-15 DIAGNOSIS — Y929 Unspecified place or not applicable: Secondary | ICD-10-CM | POA: Diagnosis not present

## 2016-08-15 DIAGNOSIS — S29012A Strain of muscle and tendon of back wall of thorax, initial encounter: Secondary | ICD-10-CM | POA: Insufficient documentation

## 2016-08-15 DIAGNOSIS — S4991XA Unspecified injury of right shoulder and upper arm, initial encounter: Secondary | ICD-10-CM | POA: Diagnosis present

## 2016-08-15 DIAGNOSIS — Y939 Activity, unspecified: Secondary | ICD-10-CM | POA: Diagnosis not present

## 2016-08-15 MED ORDER — KETOROLAC TROMETHAMINE 60 MG/2ML IM SOLN
60.0000 mg | Freq: Once | INTRAMUSCULAR | Status: AC
  Administered 2016-08-15: 60 mg via INTRAMUSCULAR
  Filled 2016-08-15: qty 2

## 2016-08-15 MED ORDER — LIDOCAINE 5 % EX PTCH: 1.0000 | MEDICATED_PATCH | CUTANEOUS | 0 refills | Status: DC

## 2016-08-15 MED ORDER — METHOCARBAMOL 500 MG PO TABS: 500.0000 mg | ORAL_TABLET | Freq: Two times a day (BID) | ORAL | 0 refills | Status: DC

## 2016-08-15 MED ORDER — IBUPROFEN 600 MG PO TABS: 600.0000 mg | ORAL_TABLET | Freq: Four times a day (QID) | ORAL | 0 refills | Status: DC | PRN

## 2016-08-15 MED ORDER — HYDROCODONE-ACETAMINOPHEN 5-325 MG PO TABS
1.0000 | ORAL_TABLET | Freq: Once | ORAL | Status: AC
  Administered 2016-08-15: 1 via ORAL
  Filled 2016-08-15: qty 1

## 2016-08-15 NOTE — Discharge Instructions (Signed)
You have been seen today for a shoulder injury. There were no acute abnormalities on the x-rays, including no sign of fracture or dislocation. Pain: Take 600 mg of ibuprofen every 6 hours or 440 mg (over the counter dose) to 500 mg (prescription dose) of naproxen every 12 hours or for the next 3 days. After this time, these medications may be used as needed for pain. Take these medications with food to avoid upset stomach. Choose only one of these medications, do not take them together.  Tylenol: Should you continue to have additional pain while taking the ibuprofen or naproxen, you may add in tylenol as needed. Your daily total maximum amount of tylenol from all sources should be limited to 4064m/day for persons without liver problems, or 20025mday for those with liver problems. Robaxin: Robaxin is a muscle relaxer may help loosen stiff muscles. Do not drive or perform other dangerous activities while taking the Robaxin. Ice: May apply ice to the area over the next 24 hours for 15 minutes at a time to reduce swelling. Elevation: Keep the extremity elevated as often as possible to reduce pain and inflammation. Support: Wear the sling for support and comfort. Wear this until pain resolves. Exercises: Start by performing these exercises a few times a week, increasing the frequency until you are performing them twice daily.  Follow up: If symptoms are improving, you may follow up with your primary care provider for any continued management. If symptoms are not improving, you may follow up with the orthopedic specialist.
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
## 2016-08-15 NOTE — ED Provider Notes (Signed)
[facility] DEPT Provider Note   CSN: [id number] Arrival date & time: 08/15/16  1337   By signing my name below, I, Eunice Blase, attest that this documentation has been prepared under the direction and in the presence of Aleksey Newbern, PA-C. Electronically signed, Eunice Blase, ED Scribe. 08/15/16. 3:29 PM.  History   Chief Complaint Chief Complaint  Patient presents with  . Shoulder Injury   The history is provided by the patient and medical records. No language interpreter was used.    Barbara Poole is a 41 y.o. female presenting to the Emergency Department concerning persistent R shoulder pain following a mechanical fall that occurred 2 days ago. She states she fell forward, caught herself with her R arm on a wall before flipping over and falling on her back. Pain in the right shoulder and upper arm is 8 out of 10, aching, nonradiating. Patient also endorses diffuse neck and back pain, moderate, described as a soreness. Pt has taken 800 mg ibuprofen and flexeril without relief. LNMP 07/15/2015; she expects her next menstrual period next week.  Denies head injury, LOC, neuro deficits, chest pain, shortness of breath, changes in bowel or bladder function, or any other complaints.  Past Medical History:  Diagnosis Date  . Abnormal uterine bleeding   . Fibroid   . Hypertension   . STD (sexually transmitted disease)    HSV-Age 42  . Ulcerative colitis ([place]) 06/2009   does not take meds    Patient Active Problem List   Diagnosis Date Noted  . Menorrhagia with irregular cycle 05/02/2016  . Fibroids, intramural 05/02/2016  . Iron deficiency anemia due to chronic blood loss 05/02/2016  . Essential hypertension 01/07/2014  . UC (ulcerative colitis) ([place]) 10/25/2009  . 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 TONSILLECTOMY    . TUBAL LIGATION  2008   post partum  . WISDOM TOOTH EXTRACTION      OB History    Gravida Para Term  Preterm AB Living   6 5 5   1 3    SAB TAB Ectopic Multiple Live Births           3       Home Medications    Prior to Admission medications   Medication Sig Start Date End Date Taking? Authorizing Provider  aspirin 81 MG tablet Take 81 mg by mouth daily. Reported on 03/31/2015    [provider]  CALCIUM PO Take 1 tablet by mouth daily. Reported on 03/31/2015    [provider]  chlorthalidone (HYGROTON) 25 MG tablet Take 25 mg by mouth daily.    [provider]  cyclobenzaprine (FLEXERIL) 10 MG tablet Take 1 tablet (10 mg total) by mouth 3 (three) times daily as needed for muscle spasms (or pain). 06/02/16   Clayton Bibles, PA-C  ibuprofen (ADVIL,MOTRIN) 600 MG tablet Take 1 tablet (600 mg total) by mouth every 6 (six) hours as needed. 08/15/16   Shah Insley C, PA-C  IRON PO Take 1 tablet by mouth daily.    [provider]  lidocaine (LIDODERM) 5 % Place 1 patch onto the skin daily. Remove & Discard patch within 12 hours or as directed by MD 08/15/16   Itzia Cunliffe C, PA-C  methocarbamol (ROBAXIN) 500 MG tablet Take 1 tablet (500 mg total) by mouth 2 (two) times daily. 08/15/16   Naiyah Klostermann C, PA-C  minocycline (DYNACIN) 50 MG tablet Take 50 mg by mouth  2 (two) times daily.    [provider]  misoprostol (CYTOTEC) 200 MCG tablet Place 1 tab vaginally night before and morning of procedure 05/01/16   Megan Salon, MD  Multiple Vitamin (MULTIVITAMIN WITH MINERALS) TABS tablet Take 1 tablet by mouth daily.    [provider]  norethindrone (MICRONOR,CAMILA,ERRIN) 0.35 MG tablet Take by mouth. 03/28/16 03/28/17  [provider]  valACYclovir (VALTREX) 500 MG tablet Take 1 tablet (500 mg total) by mouth daily. 05/01/16   Megan Salon, MD    Family History Family History  Problem Relation Age of Onset  . Hyperlipidemia Father   . Hypertension Father   . Crohn's disease Son   . Diabetes Maternal Uncle   . Diabetes Paternal Aunt   .  Hypertension Maternal Grandmother   . Diabetes Paternal Grandmother     Social History Social History  Substance Use Topics  . Smoking status: Former Smoker    Quit date: 03/10/2003  . Smokeless tobacco: Never Used  . Alcohol use 0.6 oz/week    1 Standard drinks or equivalent per week     Comment: once a week     Allergies   Patient has no known allergies.   Review of Systems Review of Systems  Respiratory: Negative for shortness of breath.   Cardiovascular: Negative for chest pain.  Gastrointestinal: Negative for abdominal pain, nausea and vomiting.  Musculoskeletal: Positive for arthralgias, back pain and neck pain.  Skin: Negative for wound.  Neurological: Negative for dizziness, syncope, weakness, light-headedness, numbness and headaches.     Physical Exam Updated Vital Signs BP 124/88 (BP Location: Left Arm)   Pulse 78   Temp 99.5 F (37.5 C) (Oral)   Ht 5' 4.5" (1.638 m)   Wt 153 lb (69.4 kg)   LMP 07/14/2016   SpO2 100%   BMI 25.86 kg/m   Physical Exam  Constitutional: She appears well-developed and well-nourished. No distress.  HENT:  Head: Normocephalic and atraumatic.  Mouth/Throat: Oropharynx is clear and moist.  Eyes: Pupils are equal, round, and reactive to light. Conjunctivae and EOM are normal.  Neck: Normal range of motion. Neck supple.  Cardiovascular: Normal rate, regular rhythm, normal heart sounds and intact distal pulses.   Pulmonary/Chest: Effort normal and breath sounds normal. No respiratory distress.  Abdominal: Soft. There is no tenderness. There is no guarding.  Musculoskeletal: She exhibits tenderness. She exhibits no edema.  Tenderness to the superior humeral head and superior R shoulder.  Tenderness to R upper back right over the scapula. Full passive and active range of motion in the bilateral shoulders, elbows, and wrists. Tenderness to bilateral cervical musculature into bilateral trapezius.  Tenderness to midline lumbar spine  into the R sided lumbar musculature.  No stepoff, deformity, or swelling noted. Normal motor function intact in all extremities and spine. No other midline spinal tenderness.   Neurological: She is alert.  No sensory deficits. Strength 5/5 in all extremities. No gait disturbance. Coordination intact including heel to shin and finger to nose. Cranial nerves III-XII grossly intact. No facial droop.  Skin: Skin is warm and dry. Capillary refill takes less than 2 seconds. She is not diaphoretic.  Psychiatric: She has a normal mood and affect. Her behavior is normal.  Nursing note and vitals reviewed.    ED Treatments / Results  DIAGNOSTIC STUDIES: Oxygen Saturation is 100% on RA, NL by my interpretation.    COORDINATION OF CARE: 3:26 PM-Discussed next steps with pt. Pt verbalized understanding  and is agreeable with the plan. Will order imaging and Rx robaxin.   Labs (all labs ordered are listed, but only abnormal results are displayed) Labs Reviewed - No data to display  EKG  EKG Interpretation None       Radiology Dg Thoracic Spine 2 View  Result Date: 08/15/2016 CLINICAL DATA:  41 year old female status post fall 2 days ago with persistent right upper extremity and back pain. EXAM: THORACIC SPINE 2 VIEWS COMPARISON:  None. FINDINGS: Normal thoracic segmentation. Bone mineralization is within normal limits. Normal thoracic vertebral height and alignment. Cervicothoracic junction alignment is within normal limits. Relatively preserved disc spaces. Posterior ribs appear intact. Negative visualized thoracic and upper abdominal visceral contours. Negative visible lumbar spine. IMPRESSION: Negative radiographic appearance of the thoracic spine. Electronically Signed   By: Genevie Ann M.D.   On: 08/15/2016 16:16   Dg Lumbar Spine Complete  Result Date: 08/15/2016 CLINICAL DATA:  41 year old female status post fall 2 days ago with persistent right upper extremity and back pain. EXAM: LUMBAR  SPINE - COMPLETE 4+ VIEW COMPARISON:  Thoracic radiographs from today reported separately. FINDINGS: Normal lumbar segmentation. Bone mineralization is within normal limits. Subtle retrolisthesis of L4 on L5 but otherwise normal vertebral height and alignment. Relatively preserved disc spaces. No pars fracture. Visible lower thoracic levels appear intact. Sacral ala and SI joints appear normal. Small pelvic phleboliths incidentally noted. IMPRESSION: Normal for age radiographic appearance of the lumbar spine. Electronically Signed   By: Genevie Ann M.D.   On: 08/15/2016 16:17   Dg Shoulder Right  Result Date: 08/15/2016 CLINICAL DATA:  Shoulder pain after fall. EXAM: RIGHT SHOULDER - 2+ VIEW COMPARISON:  None. FINDINGS: Scapular Y-view limited by superimposition of patient's hair over the shoulder. No evidence for gross fracture. No shoulder separation or dislocation. AP film shows a lucency overlying the greater tuberosity which appears to be cortically based on the axillary view. IMPRESSION: 1. No evidence for acute bony abnormality. 2. Lucency in the humeral head is indeterminate. Consider dedicated views of the humerus with internal and external rotation to further evaluate. Electronically Signed   By: Misty Stanley M.D.   On: 08/15/2016 14:32   Dg Humerus Right  Result Date: 08/15/2016 CLINICAL DATA:  41 year old female status post fall 2 days ago with persistent right upper extremity and back pain. Questionable proximal right humerus fracture on shoulder radiographs today. EXAM: RIGHT HUMERUS - 2+ VIEW COMPARISON:  1419 hours today. FINDINGS: Small lucent area with narrow zone of transition re- demonstrated near the greater tuberosity of the proximal right humerus. This has a benign appearance, and there is no associated proximal right humerus fracture. Stable alignment about the right shoulder. The right humerus shaft and distal right humerus appear intact and normal. Grossly normal alignment at the right  elbow. Negative visible right ribs and lung parenchyma. IMPRESSION: No acute fracture or dislocation identified about the right humerus. 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 Electronically Signed   By: Genevie Ann M.D.   On: 08/15/2016 16:14    Procedures Procedures (including critical care time)  Medications Ordered in ED Medications  ketorolac (TORADOL) injection 60 mg (60 mg Intramuscular Given 08/15/16 1609)  HYDROcodone-acetaminophen (NORCO/VICODIN) 5-325 MG per tablet 1 tablet (1 tablet Oral Given 08/15/16 1609)     Initial Impression / Assessment and Plan / ED Course  I have reviewed the triage vital signs and the nursing notes.  Pertinent labs & imaging results that were available during my care of the patient were reviewed by me and considered in my medical decision making (see chart for details).     Patient presents for evaluation following a fall. No acute abnormalities on imaging. No neuro or functional deficits. PCP versus orthopedic follow-up. The patient was given instructions for home care as well as return precautions. Patient voices understanding of these instructions, accepts the plan, and is comfortable with discharge.     Final Clinical Impressions(s) / ED Diagnoses   Final diagnoses:  Acute pain of right shoulder  Acute bilateral back pain, unspecified back location  Strain of right trapezius muscle, initial encounter    New Prescriptions Discharge Medication List as of 08/15/2016  4:37 PM    START taking these medications   Details  lidocaine (LIDODERM) 5 % Place 1 patch onto the skin daily. Remove & Discard patch within 12 hours or as directed by MD, Starting Tue 08/15/2016, Print    methocarbamol (ROBAXIN) 500 MG tablet Take 1 tablet (500 mg total) by mouth 2 (two) times daily., Starting Tue 08/15/2016, Print      I personally performed the services described in this  documentation, which was scribed in my presence. The recorded information has been reviewed and is accurate.   Lorayne Bender, PA-C 08/16/16 [place], [place], MD 08/17/16 [phone]

## 2016-11-03 ENCOUNTER — Telehealth: Payer: Self-pay | Admitting: Obstetrics & Gynecology

## 2016-11-03 NOTE — Telephone Encounter (Signed)
Call to patient. States she would like to see if benefits for IUD have changed since initial precert since she has now met deductible for the year.  Declines to schedule until she receivds new estimate. Advised business office will call her beginning of next week with updated info. Patient has had BTSP. Mirena is for menorrhagia, anemia and fibroids.   Routing to [facility] in business office.

## 2016-11-03 NOTE — Telephone Encounter (Signed)
Patient called requesting to schedule an IUD placement. She said she has reached her OOP cost with her insurance for the year and hopes to get this taken care of now.   Routing to triage and Deloris Ping to check benefits.

## 2016-11-06 NOTE — Telephone Encounter (Signed)
Call placed to patient to review benefits and schedule a Mirena IUD insertion. Left voicemail message requesting a return call.   cc: Dr Sabra Heck  cc: Lamont Snowball, RN

## 2016-11-07 ENCOUNTER — Other Ambulatory Visit: Payer: Self-pay | Admitting: Obstetrics & Gynecology

## 2016-11-07 MED ORDER — MISOPROSTOL 200 MCG PO TABS: ORAL_TABLET | ORAL | 0 refills | Status: DC

## 2016-11-07 NOTE — Telephone Encounter (Signed)
Spoke with patient regarding benefit for a Mirena IUD insertion.  Patient understood and agreeable. Patient ready to schedule. Patient scheduled 11/14/16 with Dr Sabra Heck. Patient aware of appointment date, arrival time and cancellation policy.   Patient states she thinks she is to take a prescribed medication prior to the appointment. Advised patient I will forward to our nurse for review.  Patient is agreeable to a return call.  Routing to Dr Sabra Heck  cc: Lamont Snowball, RN

## 2016-11-07 NOTE — Telephone Encounter (Signed)
Cytotec 243mg pv pm and am before procedure sent to pharmacy of [facility] request[facility]

## 2016-11-07 NOTE — Telephone Encounter (Signed)
Return call to patient. Left message to call back. Can speak to triage nurse.  Per Dr Ammie Ferrier office note, Cytotec was ordered and sent to pharmacy in April 2018.  New RX sent by Dr Sabra Heck.

## 2016-11-13 NOTE — Telephone Encounter (Signed)
Call to patient. Advised Dr Sabra Heck has sent Rx for Cytotec. Reviewed instructions and advised to take Motrin one hour prior to appointment with food.  Appointment scheduled for 11-14-16.   Encounter closed.

## 2016-11-14 ENCOUNTER — Ambulatory Visit (INDEPENDENT_AMBULATORY_CARE_PROVIDER_SITE_OTHER): Payer: BLUE CROSS/BLUE SHIELD | Admitting: Obstetrics & Gynecology

## 2016-11-14 VITALS — BP 128/90 | HR 72 | Resp 14 | Ht 65.0 in | Wt 156.0 lb

## 2016-11-14 DIAGNOSIS — N921 Excessive and frequent menstruation with irregular cycle: Secondary | ICD-10-CM | POA: Diagnosis not present

## 2016-11-14 NOTE — Progress Notes (Signed)
41 y.o. [id number] Single African American female presents for insertion of Mirena IUD for treatment of menorrhagia.  She has been counseled about other treatment options and feels this is the best option for her at this time.  Has been on micronor with moderate improvement in bleeding.  Feels this could be much better.  She is followed by [facility] Aly and reports having STD testing recently.  GC/CHl testing that I can see was done 2017 by Dr. Jodi Mourning.  Pt is on monogamous 5+ year relationship.  Pt has also been counseled about risks and benefits as well as complications.  Consent is obtained today.  All questions answered prior to start of procedure.    Current contraception: BLT micronor Last STD testing:  Pt reports just done a few weeks ago.  LMP:  Patient's last menstrual period was 11/13/2016.  Last Pap:  03/31/15.  Neg with neg HR HPV  Patient Active Problem List   Diagnosis Date Noted  . Menorrhagia with irregular cycle 05/02/2016  . Fibroids, intramural 05/02/2016  . Iron deficiency anemia due to chronic blood loss 05/02/2016  . Essential hypertension 01/07/2014  . UC (ulcerative colitis) ([place]) 10/25/2009  . 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 Abnormal uterine bleeding   . Fibroid   . Hypertension   . STD (sexually transmitted disease)    HSV-Age 83  . Ulcerative colitis ([place]) 06/2009   does not take meds   Current Outpatient Prescriptions on File Prior to Visit  Medication Sig Dispense Refill  . ibuprofen (ADVIL,MOTRIN) 600 MG tablet Take 1 tablet (600 mg total) by mouth every 6 (six) hours as needed. 30 tablet 0  . IRON PO Take 1 tablet by mouth daily.    . Multiple Vitamin (MULTIVITAMIN WITH MINERALS) TABS tablet Take 1 tablet by mouth daily.    . norethindrone (MICRONOR,CAMILA,ERRIN) 0.35 MG tablet Take by mouth.     No current facility-administered medications on file prior to visit.    Patient has no known allergies.  Review of Systems   All other systems reviewed and are negative.  Vitals:   11/14/16 1511  BP: 128/90  Pulse: 72  Resp: 14  Weight: 156 lb (70.8 kg)  Height: 5' 5"  (1.651 m)    Gen:  WNWF healthy female NAD Abdomen: soft, non-tender Groin:  no inguinal nodes palpated  Pelvic exam: Vulva:  normal female genitalia Vagina:  normal vagina Cervix:  Non-tender, Negative CMT, no lesions or redness. Uterus:  normal shape, About 10 weeks size, mobile, no masses noted   Procedure:  Speculum reinserted.  Cervix visualized and cleansed with Betadine x 3.  Paracervical block was not placed.  Single toothed tenaculum applied to anterior lip of cervix without difficulty.  Uterus sounded to 8cm.  Lot number: [id number].  Expiration:  01/2019.  IUD package was opened.  IUD and introducer passed to fundus and then withdrawn slightly before IUD was passed into endometrial cavity.  Introducer removed.  Strings cut to 2cm.  Tenaculum removed from cervix.  Minimal bleeding noted.  Pt tolerated the procedure well.  All instruments removed from vagina.  A: Insertion of Mirena IUD Menorrhagia  P:  Return for recheck 6-8 weeks Pt will finish current pack of micronor and then stop Pt aware to call for any concerns Pt aware removal due no later than 11/14/2021.  IUD card given to pt.

## 2016-11-28 ENCOUNTER — Telehealth: Payer: Self-pay | Admitting: Obstetrics & Gynecology

## 2016-11-28 NOTE — Telephone Encounter (Signed)
Left patient a message to call back to reschedule a future appointment that was cancelled by the provider for a two month recheck.

## 2016-12-08 NOTE — Telephone Encounter (Signed)
Left patient a message to call back to reschedule a future appointment that was cancelled by the provider for a two month recheck.

## 2016-12-15 NOTE — Telephone Encounter (Signed)
Patient rescheduled for 01/26/17 @ 11:15

## 2017-01-19 ENCOUNTER — Ambulatory Visit: Payer: BLUE CROSS/BLUE SHIELD | Admitting: Obstetrics & Gynecology

## 2017-01-26 ENCOUNTER — Ambulatory Visit: Payer: BLUE CROSS/BLUE SHIELD | Admitting: Obstetrics & Gynecology

## 2017-02-05 ENCOUNTER — Ambulatory Visit: Payer: BLUE CROSS/BLUE SHIELD | Admitting: Obstetrics & Gynecology

## 2017-02-19 ENCOUNTER — Ambulatory Visit: Payer: BLUE CROSS/BLUE SHIELD | Admitting: Obstetrics & Gynecology

## 2017-02-19 ENCOUNTER — Telehealth: Payer: Self-pay | Admitting: Obstetrics & Gynecology

## 2017-02-19 ENCOUNTER — Encounter: Payer: Self-pay | Admitting: Obstetrics & Gynecology

## 2017-02-19 NOTE — Telephone Encounter (Signed)
Called and left patient a message to call back to reschedule her missed appointment with Dr. Sabra Heck today for a 2 month recheck.

## 2017-03-08 NOTE — Telephone Encounter (Signed)
Called and left patient another message to call back to reschedule her missed appointment with Dr. Sabra Heck today for a 2 month recheck. This missed appointment letter for this missed appointment has not been sent yet. Routing to Dr. Sabra Heck for review.

## 2017-03-09 NOTE — Telephone Encounter (Signed)
OK to send letter.  Routed back to you.  Thanks.

## 2017-03-28 ENCOUNTER — Encounter: Payer: Self-pay | Admitting: Obstetrics & Gynecology

## 2017-03-28 ENCOUNTER — Ambulatory Visit: Payer: BLUE CROSS/BLUE SHIELD | Admitting: Obstetrics & Gynecology

## 2017-03-28 ENCOUNTER — Telehealth: Payer: Self-pay | Admitting: Obstetrics & Gynecology

## 2017-03-28 NOTE — Telephone Encounter (Signed)
Called patient and left a message to call back and reschedule her missed appointment she had today with Dr. Sabra Heck for a two month recheck.

## 2017-06-22 ENCOUNTER — Telehealth: Payer: Self-pay | Admitting: Obstetrics & Gynecology

## 2017-06-22 DIAGNOSIS — Z30432 Encounter for removal of intrauterine contraceptive device: Secondary | ICD-10-CM

## 2017-06-22 NOTE — Telephone Encounter (Signed)
Spoke with patient. Patient states that she would like to schedule IUD removal. Appointment scheduled for 07/06/2017 at 3 pm with Dr.Miller. Patient is agreeable to date and time. Order placed.  Routing to provider for final review. Patient agreeable to disposition. Will close encounter.

## 2017-06-22 NOTE — Telephone Encounter (Signed)
Patient would like to have her IUD removed. She has been bleeding for 2 weeks straight. Would like to have it done on June 7 or June 10.

## 2017-07-06 ENCOUNTER — Ambulatory Visit: Payer: BLUE CROSS/BLUE SHIELD | Admitting: Obstetrics & Gynecology

## 2017-07-31 ENCOUNTER — Other Ambulatory Visit: Payer: Self-pay

## 2017-07-31 ENCOUNTER — Encounter: Payer: Self-pay | Admitting: Obstetrics & Gynecology

## 2017-07-31 ENCOUNTER — Ambulatory Visit (INDEPENDENT_AMBULATORY_CARE_PROVIDER_SITE_OTHER): Payer: BLUE CROSS/BLUE SHIELD | Admitting: Obstetrics & Gynecology

## 2017-07-31 DIAGNOSIS — Z30432 Encounter for removal of intrauterine contraceptive device: Secondary | ICD-10-CM

## 2017-07-31 NOTE — Progress Notes (Signed)
GYNECOLOGY  VISIT  CC:   IUD removal  HPI: 42 y.o. [id number] Single African American female here for IUD removal.  Had  Mirena placed 10/18.  Has experienced yeast, BV, and irregular/prolonged bleeding and just wants it out.  Has done a lot of talking with friends/family.  She is considering hysterectomy.  Has many questions about this which were discussed today.  Advised would recommend repeat PUS prior to hysterectomy if this is her desire.  Last PUS was 2015 showing several small fibroids.  Last pap 3/17 was neg with neg HR HPV.  GYNECOLOGIC HISTORY: Patient's last menstrual period was 07/17/2017. Contraception: IUD Menopausal hormone therapy: none  Patient Active Problem List   Diagnosis Date Noted  . Menorrhagia with irregular cycle 05/02/2016  . Fibroids, intramural 05/02/2016  . Iron deficiency anemia due to chronic blood loss 05/02/2016  . Essential hypertension 01/07/2014  . UC (ulcerative colitis) ([place]) 10/25/2009  . 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 Abnormal uterine bleeding   . Fibroid   . Hypertension   . STD (sexually transmitted disease)    HSV-Age 81  . Ulcerative colitis ([place]) 06/2009   does not take meds    Past Surgical History:  Procedure Laterality Date  . TONSILLECTOMY    . TUBAL LIGATION  2008   post partum  . WISDOM TOOTH EXTRACTION      MEDS:   Current Outpatient Medications on File Prior to Visit  Medication Sig Dispense Refill  . acyclovir (ZOVIRAX) 400 MG tablet Take by mouth.    . hydrochlorothiazide (HYDRODIURIL) 12.5 MG tablet TAKE ONE TABLET (12.5 MG DOSE) BY MOUTH DAILY.  1  . ibuprofen (ADVIL,MOTRIN) 600 MG tablet Take 1 tablet (600 mg total) by mouth every 6 (six) hours as needed. 30 tablet 0  . IRON PO Take 1 tablet by mouth daily.    Marland Kitchen lisinopril (PRINIVIL,ZESTRIL) 20 MG tablet TAKE 1 TABLET BY MOUTH EVERY DAY    . minocycline (MINOCIN,DYNACIN) 100 MG capsule Take by mouth.    . Multiple Vitamin  (MULTIVITAMIN WITH MINERALS) TABS tablet Take 1 tablet by mouth daily.    . predniSONE (DELTASONE) 10 MG tablet Take by mouth.     No current facility-administered medications on file prior to visit.     ALLERGIES: Patient has no known allergies.  Family History  Problem Relation Age of Onset  . Hyperlipidemia Father   . Hypertension Father   . Crohn's disease Son   . Diabetes Maternal Uncle   . Diabetes Paternal Aunt   . Hypertension Maternal Grandmother   . Diabetes Paternal Grandmother     SH:  Single, non smoker  Review of Systems  Constitutional:       Weight gain    All other systems reviewed and are negative.   PHYSICAL EXAMINATION:    BP 126/84 (BP Location: Right Arm, Patient Position: Sitting, Cuff Size: Normal)   Pulse 62   Resp 12   Ht 5' 5"  (1.651 m)   Wt 180 lb (81.6 kg)   LMP 07/17/2017   BMI 29.95 kg/m     General appearance: alert, cooperative and appears stated age  Pelvic: External genitalia:  no lesions              Urethra:  normal appearing urethra with no masses, tenderness or lesions              Bartholins and Skenes: normal  Vagina: normal appearing vagina with normal color and discharge, no lesions              Cervix: no lesions and IUD string noted   Procedure:  IUD string grasped with ringed forceps and removed without difficulty with one pull.  Pt tolerated procedure well.  Chaperone was present for exam.  Assessment: IUD removal Fibroids H/O menorrhagia  Plan: Contemplating hysterectomy.  Would like this precerted.  Pt will be contacted with this information.  Would recommend PUS with surgical planning if she desires to proceed.

## 2017-08-03 ENCOUNTER — Telehealth: Payer: Self-pay | Admitting: Obstetrics & Gynecology

## 2017-08-03 ENCOUNTER — Ambulatory Visit (INDEPENDENT_AMBULATORY_CARE_PROVIDER_SITE_OTHER): Payer: BLUE CROSS/BLUE SHIELD | Admitting: Obstetrics and Gynecology

## 2017-08-03 ENCOUNTER — Other Ambulatory Visit: Payer: Self-pay

## 2017-08-03 ENCOUNTER — Encounter: Payer: Self-pay | Admitting: Obstetrics and Gynecology

## 2017-08-03 VITALS — BP 122/82 | HR 76 | Resp 12 | Ht 65.0 in | Wt 180.4 lb

## 2017-08-03 DIAGNOSIS — N921 Excessive and frequent menstruation with irregular cycle: Secondary | ICD-10-CM

## 2017-08-03 MED ORDER — NORETHINDRONE ACETATE 5 MG PO TABS: 5.0000 mg | ORAL_TABLET | Freq: Two times a day (BID) | ORAL | 0 refills | Status: DC

## 2017-08-03 NOTE — Progress Notes (Signed)
GYNECOLOGY  VISIT   HPI: 42 y.o.   Single  African American    female   [phone] with Patient's last menstrual period was 07/17/2017.   here for   Heavy vaginal bleeding.   Mirena removed 07/31/17.  Was placed 10/18.  Having clotting since day after removal of IUD.  Pad change every 1 - 2 hours.  Soaked through her clothes.  Some pelvic pain.   No dizziness or lightheadedness.   Hx fibroids.   Considering hysterectomy with Dr. Sabra Heck.   Has HTN.   GYNECOLOGIC HISTORY: Patient's last menstrual period was 07/17/2017. Contraception:  Surgical.  Tubal ligation.  Menopausal hormone therapy:  none Last mammogram:  none Last pap smear:   04/11/16        OB History    Gravida  6   Para  5   Term  5   Preterm      AB  1   Living  3     SAB      TAB      Ectopic      Multiple      Live Births  3              Patient Active Problem List   Diagnosis Date Noted  . Menorrhagia with irregular cycle 05/02/2016  . Fibroids, intramural 05/02/2016  . Iron deficiency anemia due to chronic blood loss 05/02/2016  . Essential hypertension 01/07/2014  . UC (ulcerative colitis) ([place]) 10/25/2009  . 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 Abnormal uterine bleeding   . Fibroid   . Hypertension   . STD (sexually transmitted disease)    HSV-Age 61  . Ulcerative colitis ([place]) 06/2009   does not take meds    Past Surgical History:  Procedure Laterality Date  . TONSILLECTOMY    . TUBAL LIGATION  2008   post partum  . WISDOM TOOTH EXTRACTION      Current Outpatient Medications  Medication Sig Dispense Refill  . acyclovir (ZOVIRAX) 400 MG tablet Take by mouth.    . hydrochlorothiazide (HYDRODIURIL) 12.5 MG tablet TAKE ONE TABLET (12.5 MG DOSE) BY MOUTH DAILY.  1  . ibuprofen (ADVIL,MOTRIN) 600 MG tablet Take 1 tablet (600 mg total) by mouth every 6 (six) hours as needed. 30 tablet 0  . lisinopril (PRINIVIL,ZESTRIL) 20 MG tablet TAKE 1  TABLET BY MOUTH EVERY DAY    . Multiple Vitamin (MULTIVITAMIN WITH MINERALS) TABS tablet Take 1 tablet by mouth daily.    . predniSONE (DELTASONE) 10 MG tablet Take by mouth.    . IRON PO Take 1 tablet by mouth daily.    . minocycline (MINOCIN,DYNACIN) 100 MG capsule Take by mouth.    . norethindrone (AYGESTIN) 5 MG tablet Take 1 tablet (5 mg total) by mouth 2 (two) times daily. 60 tablet 0   No current facility-administered medications for this visit.      ALLERGIES: Patient has no known allergies.  Family History  Problem Relation Age of Onset  . Hyperlipidemia Father   . Hypertension Father   . Crohn's disease Son   . Diabetes Maternal Uncle   . Diabetes Paternal Aunt   . Hypertension Maternal Grandmother   . Diabetes Paternal Grandmother     Social History   Socioeconomic History  . Marital status: Single    Spouse name: Not on file  . Number of children: Not on file  . Years of education: Not  on file  . Highest education level: Not on file  Occupational History  . Not on file  Social Needs  . Financial resource strain: Not on file  . Food insecurity:    Worry: Not on file    Inability: Not on file  . Transportation needs:    Medical: Not on file    Non-medical: Not on file  Tobacco Use  . Smoking status: Former Smoker    Last attempt to quit: 03/10/2003    Years since quitting: 14.4  . Smokeless tobacco: Never Used  Substance and Sexual Activity  . Alcohol use: Yes    Alcohol/week: 0.6 oz    Types: 1 Standard drinks or equivalent per week    Comment: once a week  . Drug use: No  . Sexual activity: Yes    Partners: Male    Birth control/protection: Condom, Pill, Surgical  Lifestyle  . Physical activity:    Days per week: Not on file    Minutes per session: Not on file  . Stress: Not on file  Relationships  . Social connections:    Talks on phone: Not on file    Gets together: Not on file    Attends religious service: Not on file    Active member of  club or organization: Not on file    Attends meetings of clubs or organizations: Not on file    Relationship status: Not on file  . Intimate partner violence:    Fear of current or ex partner: Not on file    Emotionally abused: Not on file    Physically abused: Not on file    Forced sexual activity: Not on file  Other Topics Concern  . Not on file  Social History Narrative  . Not on file    Review of Systems  Constitutional: Negative.   HENT: Negative.   Eyes: Negative.   Respiratory: Negative.   Cardiovascular: Negative.   Gastrointestinal: Negative.   Endocrine: Negative.   Genitourinary: Positive for vaginal bleeding.  Musculoskeletal: Negative.   Skin: Negative.   Neurological: Negative.   Hematological: Negative.   Psychiatric/Behavioral: Negative.     PHYSICAL EXAMINATION:    BP 122/82   Pulse 76   Resp 12   Ht 5' 5"  (1.651 m)   Wt 180 lb 6.4 oz (81.8 kg)   LMP 07/17/2017   BMI 30.02 kg/m     General appearance: alert, cooperative and appears stated age    Pelvic: External genitalia:  no lesions              Urethra:  normal appearing urethra with no masses, tenderness or lesions              Bartholins and Skenes: normal                 Vagina: normal appearing vagina with normal color and discharge, no lesions              Cervix: no lesions.  Small clot at os.                 Bimanual Exam:  Uterus:  normal size, contour, position, consistency, mobility,  mildy tender.               Adnexa: no mass, fullness, tenderness               Chaperone was present for exam.  ASSESSMENT  Withdrawal bleeding after Mirena IUD removal.  Hx fibroids.  PLAN  We talked about the likely reason for the bleeding now.  Check Hgb now and then formal CBC.  Hgb 12.5. Aygesting 5 mg po bid, #60, RF none.  Pelvic US next week with Dr. Sabra Heck.    An After Visit Summary was printed and given to the patient.  ___15___ minutes face to face time of which over 50% was  spent in counseling.

## 2017-08-03 NOTE — Patient Instructions (Signed)
Norethindrone acetate (hormone replacement) What is this medicine? NORETHINDRONE ACETATE (nor eth IN drone AS e tate) is a female hormone. This medicine is used to treat endometriosis, uterine bleeding caused by abnormal hormone levels, and secondary amenorrhea. Secondary amenorrhea is when a woman stops getting menstrual periods due to low levels of certain female hormones. This medicine may be used for other purposes; ask your health care provider or pharmacist if you have questions. COMMON BRAND NAME(S): Aygestin What should I tell my health care provider before I take this medicine? They need to know if you have any of these conditions: -blood vessel disease or blood clots -breast, cervical, or vaginal cancer -diabetes -heart disease -kidney disease -liver disease -mental depression -migraine -seizures -stroke -vaginal bleeding -an unusual or allergic reaction to norethindrone, other medicines, foods, dyes, or preservatives -pregnant or trying to get pregnant -breast-feeding How should I use this medicine? Take this medicine by mouth with a glass of water. You may take this medicine with or without food. Follow the directions on the prescription label. Take this medicine at the same time each day. Do not take your medicine more often than directed. A patient information sheet will be given with each prescription and refill. Read this sheet carefully each time. The sheet may change frequently. Talk to your pediatrician regarding the use of this medicine in children. Special care may be needed. Overdosage: If you think you have taken too much of this medicine contact a poison control center or emergency room at once. NOTE: This medicine is only for you. Do not share this medicine with others. What if I miss a dose? If you miss a dose, take it as soon as you can. If it is almost time for your next dose, take only that dose. Do not take double or extra doses. What may interact with this  medicine? Do not take this medicine with any of the following medications: -amprenavir or fosamprenavir -bosentan This medicine may also interact with the following medications: -antibiotics or medicines for infections, especially rifampin, rifabutin, rifapentine, and griseofulvin, and possibly penicillins or tetracyclines -aprepitant -barbiturate medicines, such as phenobarbital -carbamazepine -felbamate -modafinil -oxcarbazepine -phenytoin -ritonavir or other medicines for HIV infection or AIDS -St. John's wort -topiramate This list may not describe all possible interactions. Give your health care provider a list of all the medicines, herbs, non-prescription drugs, or dietary supplements you use. Also tell them if you smoke, drink alcohol, or use illegal drugs. Some items may interact with your medicine. What should I watch for while using this medicine? Visit your doctor or health care professional for regular checks on your progress. You will need a regular breast and pelvic exam and Pap smear while on this medicine. If you have any reason to think you are pregnant, stop taking this medicine right away and contact your doctor or health care professional. If you are taking this medicine for hormone related problems, it may take several cycles of use to see improvement in your condition. What side effects may I notice from receiving this medicine? Side effects that you should report to your doctor or health care professional as soon as possible: -breast tenderness or discharge -pain in the abdomen, chest, groin or leg -severe headache -skin rash, itching, or hives -sudden shortness of breath -unusually weak or tired -vision or speech problems -yellowing of skin or eyes Side effects that usually do not require medical attention (report to your doctor or health care professional if they continue or are bothersome): -  changes in sexual desire -change in menstrual flow -facial hair  growth -fluid retention and swelling -headache -irritability -nausea -weight gain or loss This list may not describe all possible side effects. Call your doctor for medical advice about side effects. You may report side effects to FDA at 1-800-FDA-1088. Where should I keep my medicine? Keep out of the reach of children. Store at room temperature between 15 and 30 degrees C (59 and 86 degrees F). Throw away any unused medicine after the expiration date. NOTE: This sheet is a summary. It may not cover all possible information. If you have questions about this medicine, talk to your doctor, pharmacist, or health care provider.  2018 Elsevier/Gold Standard (2007-08-12 14:38:36)

## 2017-08-03 NOTE — Progress Notes (Signed)
Scheduled patient for PUS while in office on 08/09/2017 at 4 pm with 4:30 pm consult with Dr.Miller. Patient is agreeable to date and time.

## 2017-08-03 NOTE — Telephone Encounter (Signed)
Patient stated that she got her IUD removed on Tuesday. She is bleeding through a tampon and pad every 2 hours. Patient stated that she is having blood clots.

## 2017-08-03 NOTE — Telephone Encounter (Signed)
Call to patient. States she is currently in dental procedure (difficult to understand) and has 30 minutes left of  appointment.   States still has heavy bleeding. Advised to come to office following dentist for work in appointment.   Routing to Dr Quincy Simmonds.

## 2017-08-04 LAB — CBC
Hematocrit: 38.7 % (ref 34.0–46.6)
Hemoglobin: 12.9 g/dL (ref 11.1–15.9)
MCH: 30 pg (ref 26.6–33.0)
MCHC: 33.3 g/dL (ref 31.5–35.7)
MCV: 90 fL (ref 79–97)
Platelets: 405 10*3/uL (ref 150–450)
RBC: 4.3 x10E6/uL (ref 3.77–5.28)
RDW: 13.3 % (ref 12.3–15.4)
WBC: 11.2 10*3/uL — AB (ref 3.4–10.8)

## 2017-08-07 ENCOUNTER — Telehealth: Payer: Self-pay | Admitting: *Deleted

## 2017-08-07 NOTE — Telephone Encounter (Signed)
Notes recorded by Burnice Logan, RN on 08/07/2017 at 8:33 AM EDT Attempted number on file, [phone], number disconnected. Called alternative contact on dpr, spoke with Leda Gauze, alternative number provided for patient [phone]. Call placed, left message to call Sharee Pimple at [phone].

## 2017-08-07 NOTE — Telephone Encounter (Signed)
-----   Message from Nunzio Cobbs, MD sent at 08/06/2017  9:53 PM EDT ----- Please inform patient of her CBC result.   She came in for heavy bleeding following removal of her Mirena IUD.  Her hemoglobin is normal, so she is not anemic.  Her white blood cell count was mildly elevated.  This can happen with pain, being nervous, or having inflammation/infection.  I will forward this result to Dr. Sabra Heck for the patient's follow up ultrasound visit and discussion of next steps in her care.   Cc- Dr. Sabra Heck

## 2017-08-08 ENCOUNTER — Other Ambulatory Visit: Payer: Self-pay | Admitting: *Deleted

## 2017-08-08 DIAGNOSIS — N921 Excessive and frequent menstruation with irregular cycle: Secondary | ICD-10-CM

## 2017-08-09 ENCOUNTER — Other Ambulatory Visit: Payer: BLUE CROSS/BLUE SHIELD | Admitting: Obstetrics & Gynecology

## 2017-08-09 ENCOUNTER — Other Ambulatory Visit: Payer: BLUE CROSS/BLUE SHIELD

## 2017-08-09 NOTE — Telephone Encounter (Signed)
Patient arrived for PUS/consult and was not aware of OOP due for today.   Patient was not prepared and wishes to reschedule the appointment. To triage to assist with rescheduling.

## 2017-08-14 NOTE — Telephone Encounter (Signed)
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 Left voicemail message requesting a return call. Upon return call, will need to review ultrasound options.   cc: Lamont Snowball, RN

## 2017-08-14 NOTE — Telephone Encounter (Signed)
Call to patietn. Per ROI can leave message on voice mail. Phone number on ROI has been changed. Left message on  [phone] calling to follow-up on appointment. Left message to call back.

## 2017-08-14 NOTE — Telephone Encounter (Signed)
Return call from patient.  Requests to proceed with pelvic ultrasound at [facility]. Advised of lab results. Discussed surgery date options. Requests early September. Possible 10-09-17. Patient is no longer having bleeding or pain after IUD removal.   Advised will coordinate pelvic ultrasound with surgery consult.

## 2017-08-16 NOTE — Telephone Encounter (Signed)
See previous notes. Call placed to follow up, presenting scheduling options for recommended ultrasound. Left voicemail message requesting a return call    cc: Lamont Snowball, RN

## 2017-08-17 NOTE — Telephone Encounter (Signed)
Patient returning call to Suzy. °

## 2017-08-20 NOTE — Telephone Encounter (Signed)
Spoke with patient regarding scheduling options for a recommended ultrasound. Patient understood and agreeable, to options. Patient ready to schedule. Patient scheduled 08/30/17 with Dr Sabra Heck. Patient aware of appointment date, arrival time and   cancellation policy. Patient is agreeable to disposition.   Forwarding to Lamont Snowball, RN

## 2017-08-20 NOTE — Telephone Encounter (Signed)
Routing to provider for review.  Will close encounter.

## 2017-08-21 ENCOUNTER — Other Ambulatory Visit: Payer: Self-pay | Admitting: Obstetrics and Gynecology

## 2017-08-22 NOTE — Telephone Encounter (Signed)
Medication refill request: Norethindrone 79m Last AEX:  Office visit 08/03/17 Next AEX: 08/30/17 Last MMG (if hormonal medication request): na Refill authorized: please advise

## 2017-08-27 ENCOUNTER — Telehealth: Payer: Self-pay | Admitting: Obstetrics & Gynecology

## 2017-08-27 DIAGNOSIS — N921 Excessive and frequent menstruation with irregular cycle: Secondary | ICD-10-CM

## 2017-08-27 DIAGNOSIS — D251 Intramural leiomyoma of uterus: Secondary | ICD-10-CM

## 2017-08-27 MED ORDER — NORETHINDRONE ACETATE 5 MG PO TABS: 10.0000 mg | ORAL_TABLET | Freq: Every day | ORAL | 0 refills | Status: DC

## 2017-08-27 NOTE — Telephone Encounter (Signed)
Call again to patient and given ultrasound appointment and follow up with Dr. Sabra Heck. Pt agreeable to plan. Again discussed bleeding precautions and s/s of anemia and need for evaluation. Patient verbalized understanding and agrees to plan as discussed. Will call back with any questions or concerns.   Pt verbalized understanding of emergency disposition-she declines 911 or office visit. Medication instructions given, appointment for ultrasound made with follow up. Encounter is closed.

## 2017-08-27 NOTE — Telephone Encounter (Signed)
Return call to patient.  Bleeding had stopped with Aygestin 5 mg BID. Refill was sent to [facility] on [facility]. Patient states she is completely out of pills and pharmacy will not let her refill. She states when she originally picked up the prescription she was taking 3 tablets per day and that is why she ran out of her medication early. She is aware she was to take Aygestin 5 mg BID.   Her last pill was taken on Wednesday and on Friday she started having light period spotting and Saturday she began to have heavier bleeding and continues to use an overnight pad and ultra tampon. She reports she changes these hourly and they are soaked, but changes often for cleanliness.   She is offered an appointment today, but declines.  We discussed bleeding precautions and I advised patient that soaking a pad per hour is a medical emergency and discussed the need for evaluation. She states she feels fine as of right now but verbalizes understanding of my instructions and states she understands the seriousness of her complaints.    She declines her ultrasound appointment at this time.    Advised would review with Dr. Sabra Heck and return call with any change in instructions. Advised patient to call [facility] and discuss picking up rx early.

## 2017-08-27 NOTE — Telephone Encounter (Signed)
1. Patient called and cancelled her ultrasound for Thursday, 08/30/17. She'd like to reschedule to about two weeks out.  2. Patient requested a refill on Norethindron. She said she's started having very heavy menstrual bleeding again and is soaking a pad or tampon every hour.  Last seen: 08/03/17

## 2017-08-27 NOTE — Telephone Encounter (Signed)
Return call to patient after review with Dr. Sabra Heck. Pt states she has not been able to contact [facility] to discuss her prescription. Advised new rx sent in, Aygestin 10 mg po. Take one half tablet po bid #60 with no refills per Dr. Sabra Heck. [facility].  Patient agreeable to planning ultrasound at [facility] hospital with follow up with Dr. Sabra Heck. Scheduled ultrasound at [facility] hospital patient requests an appointment after 3:30, first available afternoon appointment is 09/04/17 at [facility] hospital.

## 2017-08-30 ENCOUNTER — Other Ambulatory Visit: Payer: BLUE CROSS/BLUE SHIELD | Admitting: Obstetrics & Gynecology

## 2017-08-30 ENCOUNTER — Other Ambulatory Visit: Payer: BLUE CROSS/BLUE SHIELD

## 2017-09-04 ENCOUNTER — Ambulatory Visit (HOSPITAL_COMMUNITY)
Admission: RE | Admit: 2017-09-04 | Discharge: 2017-09-04 | Disposition: A | Discharge status: Home / Self Care | Payer: BLUE CROSS/BLUE SHIELD | Source: Ambulatory Visit | Attending: Obstetrics & Gynecology | Admitting: Obstetrics & Gynecology

## 2017-09-04 DIAGNOSIS — D251 Intramural leiomyoma of uterus: Secondary | ICD-10-CM | POA: Insufficient documentation

## 2017-09-04 DIAGNOSIS — N921 Excessive and frequent menstruation with irregular cycle: Secondary | ICD-10-CM | POA: Insufficient documentation

## 2017-09-07 ENCOUNTER — Ambulatory Visit: Payer: Self-pay | Admitting: Obstetrics & Gynecology

## 2017-09-07 ENCOUNTER — Telehealth: Payer: Self-pay | Admitting: *Deleted

## 2017-09-07 NOTE — Telephone Encounter (Signed)
-----   Message from Megan Salon, MD sent at 09/06/2017  5:47 PM EDT ----- Please let pt know that her ultrasound shows fibroids as her previous ultrasound showed.  These are larger than in the past.  I'd like to know what she is thinking from a surgery perspective.  Thanks.

## 2017-09-07 NOTE — Telephone Encounter (Signed)
Call to patient. DPR reviewed. Phone number on DPR is " no longer in service."  Call to number listed in demographics, left message to call back. No clinical information left.

## 2017-09-11 NOTE — Telephone Encounter (Signed)
Patient returning Sally's call. Will be at work until 4:30, but can be reached after that.

## 2017-09-11 NOTE — Telephone Encounter (Signed)
Call to patient to review results. Phone number on DPR remains disconnected.  Left message on number listed in demographics, requested return call. No details given on message.

## 2017-09-12 NOTE — Telephone Encounter (Signed)
Call from patient. Advised of ultrasound results as directed by Dr Sabra Heck. Patient states she can feel the difference in size.  Desires to proceed with surgery. Tentative plan for 10-09-17 pending consult with Dr Sabra Heck on 09-21-17 to discuss results and options.  If decides to proceed, will review all surgery instructions at that time as patient has limited time to talk  ( on work break) .  Encounter closed.

## 2017-09-19 ENCOUNTER — Other Ambulatory Visit: Payer: Self-pay | Admitting: Obstetrics & Gynecology

## 2017-09-21 ENCOUNTER — Encounter: Payer: Self-pay | Admitting: Obstetrics & Gynecology

## 2017-09-21 ENCOUNTER — Ambulatory Visit: Payer: BLUE CROSS/BLUE SHIELD | Admitting: Obstetrics & Gynecology

## 2017-09-21 ENCOUNTER — Other Ambulatory Visit (HOSPITAL_COMMUNITY)
Admission: RE | Admit: 2017-09-21 | Discharge: 2017-09-21 | Disposition: A | Discharge status: Home / Self Care | Payer: BLUE CROSS/BLUE SHIELD | Source: Ambulatory Visit | Attending: Obstetrics & Gynecology | Admitting: Obstetrics & Gynecology

## 2017-09-21 VITALS — BP 110/63 | HR 72 | Resp 14 | Ht 65.0 in | Wt 184.0 lb

## 2017-09-21 DIAGNOSIS — Z124 Encounter for screening for malignant neoplasm of cervix: Secondary | ICD-10-CM

## 2017-09-21 DIAGNOSIS — Z0289 Encounter for other administrative examinations: Secondary | ICD-10-CM

## 2017-09-21 DIAGNOSIS — N921 Excessive and frequent menstruation with irregular cycle: Secondary | ICD-10-CM | POA: Diagnosis not present

## 2017-09-21 DIAGNOSIS — D251 Intramural leiomyoma of uterus: Secondary | ICD-10-CM

## 2017-09-21 NOTE — Progress Notes (Signed)
42 y.o. [id number] Single African American female here for discussion of upcoming procedure.  TLH/bilateral salpingectomy, cystoscopy is planned due to menorrhagia and uterine fibroids.  She did have a Mirena IUD that cause a significant amount of irregular spotting.  This was completley bothersome to the patient so IUD was removed.  Bleeding worsened significantly but she does not want to try and additional hormonal therapy.   She has decided to proceed with definitive management of bleeding with hysterectomy.  Pt has ultrasound performed 09/04/17.  Uterus 9.5 x 5.0 x 8.1cm with multiple fibroids present. Fibroids measured were 1.6 and 2.5 and 1.8cm.  Endometrium measured about 80m but was difficult to measure due to fibroids.  Right ovary was 3.4 x 1.6 x 1.3cm and left ovary was 3.4 x 1.0 x 1.3cm.  Last previous ultrasound was 02/06/13 and 4-5 fibroids noted all <1cm.   Procedure discussed with patient.  Hospital stay, recovery and pain management all discussed.  Risks discussed including but not limited to bleeding, 1% risk of receiving a  transfusion, infection, 3-4% risk of bowel/bladder/ureteral/vascular injury discussed as well as possible need for additional surgery if injury does occur discussed.  DVT/PE and rare risk of death discussed.  My actual complications with prior surgeries discussed.  Vaginal cuff dehiscence discussed.  Hernia formation discussed.  Positioning and incision locations discussed.  Patient aware if pathology abnormal she may need additional treatment.  All questions answered.          Ob Hx:   No LMP recorded.          Sexually active: Yes.   Birth control: bilateral tubal ligation Last pap: 03/31/15 Neg. HR HPV:neg  Last MHRC:BULA Tobacco:  Past Surgical History:  Procedure Laterality Date  . TONSILLECTOMY    . TUBAL LIGATION  2008   post partum  . WISDOM TOOTH EXTRACTION       Past Medical History:  Diagnosis Date  . Abnormal uterine bleeding   . Fibroid   .  Hypertension   . STD (sexually transmitted disease)    HSV-Age 89  . Ulcerative colitis ([place] 06/2009   does not take meds    Allergies: Patient has no known allergies.  Current Outpatient Medications  Medication Sig Dispense Refill  . acyclovir (ZOVIRAX) 400 MG tablet Take by mouth.    . hydrochlorothiazide (HYDRODIURIL) 12.5 MG tablet TAKE ONE TABLET (12.5 MG DOSE) BY MOUTH DAILY.  1  . ibuprofen (ADVIL,MOTRIN) 600 MG tablet Take 1 tablet (600 mg total) by mouth every 6 (six) hours as needed. 30 tablet 0  . IRON PO Take 1 tablet by mouth daily.    .Marland Kitchenlisinopril (PRINIVIL,ZESTRIL) 20 MG tablet TAKE 1 TABLET BY MOUTH EVERY DAY    . minocycline (MINOCIN,DYNACIN) 100 MG capsule Take by mouth.    . Multiple Vitamin (MULTIVITAMIN WITH MINERALS) TABS tablet Take 1 tablet by mouth daily.    . norethindrone (AYGESTIN) 5 MG tablet Take 2 tablets (10 mg total) by mouth daily. 60 tablet 0  . predniSONE (DELTASONE) 10 MG tablet Take by mouth.     No current facility-administered medications for this visit.     ROS: Pertinent items noted in HPI and remainder of comprehensive ROS otherwise negative.  Exam:    There were no vitals taken for this visit.  General appearance: alert and cooperative Head: Normocephalic, without obvious abnormality, atraumatic Neck: no adenopathy, supple, symmetrical, trachea midline and thyroid not enlarged, symmetric, no tenderness/mass/nodules Lungs: clear to auscultation bilaterally  Heart: regular rate and rhythm, S1, S2 normal, no murmur, click, rub or gallop Abdomen: soft, non-tender; bowel sounds normal; no masses,  no organomegaly Extremities: extremities normal, atraumatic, no cyanosis or edema Skin: Skin color, texture, turgor normal. No rashes or lesions Lymph nodes: Cervical, supraclavicular, and axillary nodes normal. no inguinal nodes palpated Neurologic: Grossly normal  Pelvic: External genitalia:  no lesions              Urethra: normal appearing  urethra with no masses, tenderness or lesions              Bartholins and Skenes: normal                 Vagina: normal appearing vagina with normal color and discharge, no lesions              Cervix: normal appearance and and PAP obtained              Pap taken: Yes.          Bimanual Exam:  Uterus:  uterus is normal size, shape, consistency and nontender, enlarged to 10 week's size                                      Adnexa:    normal adnexa in size, nontender and no masses  Endometrial biopsy recommended.  Discussed with patient.  Verbal and written consent obtained.   Procedure:  Speculum placed.  Cervix visualized and cleansed with betadine prep.  A single toothed tenaculum was applied to the anterior lip of the cervix.  Endometrial pipelle was advanced through the cervix into the endometrial cavity without difficulty.  Pipelle passed to cm.  Suction applied and pipelle removed with good tissue sample obtained.  Tenculum removed.  No bleeding noted.  Patient tolerated procedure well.  A: Menorrhagia Uterine fibroids Hypertension Ulcerative colitis     P:  TLH/bilateral salpingectomy/cystoscopy planned Pap and endometrial biopsy pending.  Results will be called to pt. Will plan stress dosed steroids Hysterectomy brochure given for pre and post op instructions given.

## 2017-09-26 LAB — CYTOLOGY - PAP: DIAGNOSIS: NEGATIVE

## 2017-09-26 NOTE — Patient Instructions (Addendum)
Barbara Poole  09/26/2017      Your procedure is scheduled on   10-09-17  Report to [facility]  at      [facility]  Call this number if you have problems the morning of surgery:[phone]  OUR ADDRESS IS [place], WE ARE LOCATED [facility] WITH [facility].   Remember:  Do not eat food or drink liquids after midnight.  Take these medicines the morning of surgery with A SIP OF WATERnone   Do not wear jewelry, make-up or nail polish.  Do not wear lotions, powders, or perfumes, or deoderant.  Do not shave 48 hours prior to surgery.    Do not bring valuables to the hospital.  [facility] is not responsible for any belongings or valuables.  Contacts, dentures or bridgework may not be worn into surgery.  Leave your suitcase in the car.  After surgery it may be brought to your room.  For patients admitted to the hospital, discharge time will be determined by your treatment team.  Patients discharged the day of surgery will not be allowed to drive home.   Special instructions:  N/A  Please read over the following fact sheets that you were given:    [facility] - Preparing for Surgery Before surgery, you can play an important role.  Because skin is not sterile, your skin needs to be as free of germs as possible.  You can reduce the number of germs on your skin by washing with CHG (chlorahexidine gluconate) soap before surgery.  CHG is an antiseptic cleaner which kills germs and bonds with the skin to continue killing germs even after washing. Please DO NOT use if you have an allergy to CHG or antibacterial soaps.  If your skin becomes reddened/irritated stop using the CHG and inform your nurse when you arrive at Short Stay. Do not shave (including legs and underarms) for at least 48 hours prior to the first CHG shower.  You may shave your face/neck. Please follow these instructions carefully:  1.  Shower with CHG Soap the night before  surgery and the  morning of Surgery.  2.  If you choose to wash your hair, wash your hair first as usual with your  normal  shampoo.  3.  After you shampoo, rinse your hair and body thoroughly to remove the  shampoo.                           4.  Use CHG as you would any other liquid soap.  You can apply chg directly  to the skin and wash                       Gently with a scrungie or clean washcloth.  5.  Apply the CHG Soap to your body ONLY FROM THE NECK DOWN.   Do not use on face/ open                           Wound or open sores. Avoid contact with eyes, ears mouth and genitals (private parts).                       Wash face,  Genitals (private parts) with your normal soap.             6.  Wash thoroughly, paying special attention to the area where your surgery  will be performed.  7.  Thoroughly rinse your body with warm water from the neck down.  8.  DO NOT shower/wash with your normal soap after using and rinsing off  the CHG Soap.                9.  Pat yourself dry with a clean towel.            10.  Wear clean pajamas.            11.  Place clean sheets on your bed the night of your first shower and do not  sleep with pets. Day of Surgery : Do not apply any lotions/deodorants the morning of surgery.  Please wear clean clothes to the hospital/surgery center.  FAILURE TO FOLLOW THESE INSTRUCTIONS MAY RESULT IN THE CANCELLATION OF YOUR SURGERY PATIENT SIGNATURE_________________________________  NURSE SIGNATURE__________________________________  ________________________________________________________________________

## 2017-09-27 ENCOUNTER — Other Ambulatory Visit: Payer: Self-pay | Admitting: Obstetrics & Gynecology

## 2017-09-27 ENCOUNTER — Encounter: Payer: Self-pay | Admitting: Obstetrics & Gynecology

## 2017-09-27 DIAGNOSIS — Z7952 Long term (current) use of systemic steroids: Secondary | ICD-10-CM

## 2017-09-27 NOTE — Progress Notes (Signed)
cortisol order placed.

## 2017-09-28 ENCOUNTER — Other Ambulatory Visit: Payer: Self-pay

## 2017-09-28 ENCOUNTER — Encounter (HOSPITAL_COMMUNITY)
Admission: RE | Admit: 2017-09-28 | Discharge: 2017-09-28 | Disposition: A | Discharge status: Home / Self Care | Payer: BLUE CROSS/BLUE SHIELD | Source: Ambulatory Visit | Attending: Obstetrics & Gynecology | Admitting: Obstetrics & Gynecology

## 2017-09-28 ENCOUNTER — Encounter (HOSPITAL_COMMUNITY): Payer: Self-pay

## 2017-09-28 DIAGNOSIS — D509 Iron deficiency anemia, unspecified: Secondary | ICD-10-CM | POA: Diagnosis not present

## 2017-09-28 DIAGNOSIS — D259 Leiomyoma of uterus, unspecified: Secondary | ICD-10-CM | POA: Insufficient documentation

## 2017-09-28 DIAGNOSIS — Z01812 Encounter for preprocedural laboratory examination: Secondary | ICD-10-CM | POA: Insufficient documentation

## 2017-09-28 DIAGNOSIS — N920 Excessive and frequent menstruation with regular cycle: Secondary | ICD-10-CM | POA: Diagnosis not present

## 2017-09-28 DIAGNOSIS — Z01810 Encounter for preprocedural cardiovascular examination: Secondary | ICD-10-CM | POA: Diagnosis present

## 2017-09-28 HISTORY — DX: Headache: R51

## 2017-09-28 HISTORY — DX: Headache, unspecified: R51.9

## 2017-09-28 HISTORY — DX: Anemia, unspecified: D64.9

## 2017-09-28 LAB — BASIC METABOLIC PANEL
ANION GAP: 9 (ref 5–15)
BUN: 11 mg/dL (ref 6–20)
CHLORIDE: 105 mmol/L (ref 98–111)
CO2: 25 mmol/L (ref 22–32)
Calcium: 8.9 mg/dL (ref 8.9–10.3)
Creatinine, Ser: 0.97 mg/dL (ref 0.44–1.00)
Glucose, Bld: 88 mg/dL (ref 70–99)
POTASSIUM: 3.8 mmol/L (ref 3.5–5.1)
SODIUM: 139 mmol/L (ref 135–145)

## 2017-09-28 LAB — CBC
HCT: 39 % (ref 36.0–46.0)
HEMOGLOBIN: 12.5 g/dL (ref 12.0–15.0)
MCH: 28.8 pg (ref 26.0–34.0)
MCHC: 32.1 g/dL (ref 30.0–36.0)
MCV: 89.9 fL (ref 78.0–100.0)
PLATELETS: 362 10*3/uL (ref 150–400)
RBC: 4.34 MIL/uL (ref 3.87–5.11)
RDW: 13.1 % (ref 11.5–15.5)
WBC: 9.1 10*3/uL (ref 4.0–10.5)

## 2017-10-04 ENCOUNTER — Other Ambulatory Visit (INDEPENDENT_AMBULATORY_CARE_PROVIDER_SITE_OTHER): Payer: BLUE CROSS/BLUE SHIELD

## 2017-10-04 DIAGNOSIS — Z7952 Long term (current) use of systemic steroids: Secondary | ICD-10-CM

## 2017-10-05 LAB — CORTISOL: CORTISOL: 6.6 ug/dL

## 2017-10-08 ENCOUNTER — Encounter: Payer: Self-pay | Admitting: Obstetrics & Gynecology

## 2017-10-08 ENCOUNTER — Telehealth: Payer: Self-pay | Admitting: *Deleted

## 2017-10-08 NOTE — Telephone Encounter (Signed)
-----   Message from Megan Salon, MD sent at 09/27/2017  8:14 PM EDT ----- 02 pap recall.  Please let pt know her pap smear was normal and her endometrial biopsy was negative for abnormal cells.  She is scheduled for a hysterectomy on 10/09/17.

## 2017-10-08 NOTE — Telephone Encounter (Signed)
Notes recorded by Polly Cobia, CMA on 10/08/2017 at 2:46 PM EDT Pt notified. Verbalized understanding.

## 2017-10-08 NOTE — Telephone Encounter (Signed)
Notes recorded by Megan Salon, MD on 10/08/2017 at 1:01 PM EDT Please let pt know cortisol level is fine. I'm planning to give stress dosed steroid to her due to chronic steroid use for Ulcerative colitis. This will help her body with any added stress of surgery.  Left voicemail to call back.

## 2017-10-09 ENCOUNTER — Ambulatory Visit (HOSPITAL_BASED_OUTPATIENT_CLINIC_OR_DEPARTMENT_OTHER)
Admission: RE | Admit: 2017-10-09 | Discharge: 2017-10-10 | Disposition: A | Discharge status: Home / Self Care | Payer: BLUE CROSS/BLUE SHIELD | Source: Ambulatory Visit | Attending: Obstetrics & Gynecology | Admitting: Obstetrics & Gynecology

## 2017-10-09 ENCOUNTER — Encounter (HOSPITAL_BASED_OUTPATIENT_CLINIC_OR_DEPARTMENT_OTHER): Payer: Self-pay | Admitting: *Deleted

## 2017-10-09 ENCOUNTER — Ambulatory Visit (HOSPITAL_BASED_OUTPATIENT_CLINIC_OR_DEPARTMENT_OTHER): Payer: BLUE CROSS/BLUE SHIELD | Admitting: Certified Registered"

## 2017-10-09 ENCOUNTER — Encounter (HOSPITAL_BASED_OUTPATIENT_CLINIC_OR_DEPARTMENT_OTHER)
Admission: RE | Disposition: A | Discharge status: Home / Self Care | Payer: Self-pay | Source: Ambulatory Visit | Attending: Obstetrics & Gynecology

## 2017-10-09 DIAGNOSIS — I10 Essential (primary) hypertension: Secondary | ICD-10-CM | POA: Insufficient documentation

## 2017-10-09 DIAGNOSIS — D259 Leiomyoma of uterus, unspecified: Secondary | ICD-10-CM | POA: Insufficient documentation

## 2017-10-09 DIAGNOSIS — N920 Excessive and frequent menstruation with regular cycle: Secondary | ICD-10-CM | POA: Diagnosis present

## 2017-10-09 DIAGNOSIS — N921 Excessive and frequent menstruation with irregular cycle: Secondary | ICD-10-CM | POA: Diagnosis not present

## 2017-10-09 DIAGNOSIS — K5190 Ulcerative colitis, unspecified, without complications: Secondary | ICD-10-CM | POA: Diagnosis not present

## 2017-10-09 DIAGNOSIS — N800 Endometriosis of uterus: Secondary | ICD-10-CM | POA: Diagnosis not present

## 2017-10-09 DIAGNOSIS — Z79899 Other long term (current) drug therapy: Secondary | ICD-10-CM | POA: Insufficient documentation

## 2017-10-09 DIAGNOSIS — Z87891 Personal history of nicotine dependence: Secondary | ICD-10-CM | POA: Insufficient documentation

## 2017-10-09 DIAGNOSIS — D509 Iron deficiency anemia, unspecified: Secondary | ICD-10-CM | POA: Insufficient documentation

## 2017-10-09 DIAGNOSIS — D251 Intramural leiomyoma of uterus: Secondary | ICD-10-CM

## 2017-10-09 HISTORY — PX: CYSTOSCOPY: SHX5120

## 2017-10-09 HISTORY — PX: TOTAL LAPAROSCOPIC HYSTERECTOMY WITH SALPINGECTOMY: SHX6742

## 2017-10-09 LAB — POCT PREGNANCY, URINE: Preg Test, Ur: NEGATIVE

## 2017-10-09 SURGERY — HYSTERECTOMY, TOTAL, LAPAROSCOPIC, WITH SALPINGECTOMY
Anesthesia: General | Site: Abdomen

## 2017-10-09 MED ORDER — HYDROCORTISONE NA SUCCINATE PF 100 MG IJ SOLR
INTRAMUSCULAR | Status: AC
  Filled 2017-10-09: qty 2

## 2017-10-09 MED ORDER — ALUM & MAG HYDROXIDE-SIMETH 200-200-20 MG/5ML PO SUSP
30.0000 mL | ORAL | Status: DC | PRN
  Filled 2017-10-09: qty 30

## 2017-10-09 MED ORDER — MORPHINE SULFATE (PF) 2 MG/ML IV SOLN
1.0000 mg | INTRAVENOUS | Status: DC | PRN
  Administered 2017-10-09 (×2): 1 mg via INTRAVENOUS
  Filled 2017-10-09: qty 1

## 2017-10-09 MED ORDER — ONDANSETRON HCL 4 MG/2ML IJ SOLN
4.0000 mg | INTRAMUSCULAR | Status: DC | PRN
  Filled 2017-10-09: qty 2

## 2017-10-09 MED ORDER — MIDAZOLAM HCL 5 MG/5ML IJ SOLN
INTRAMUSCULAR | Status: DC | PRN
  Administered 2017-10-09: 2 mg via INTRAVENOUS

## 2017-10-09 MED ORDER — LACTATED RINGERS IV SOLN
INTRAVENOUS | Status: DC
  Filled 2017-10-09: qty 1000

## 2017-10-09 MED ORDER — PREDNISONE 5 MG PO TABS
15.0000 mg | ORAL_TABLET | Freq: Every day | ORAL | Status: DC
  Administered 2017-10-10: 15 mg via ORAL
  Filled 2017-10-09 (×2): qty 1

## 2017-10-09 MED ORDER — ESMOLOL HCL 100 MG/10ML IV SOLN
INTRAVENOUS | Status: AC
  Filled 2017-10-09: qty 10

## 2017-10-09 MED ORDER — METOPROLOL TARTRATE 5 MG/5ML IV SOLN
INTRAVENOUS | Status: DC | PRN
  Administered 2017-10-09: 1 mg via INTRAVENOUS

## 2017-10-09 MED ORDER — FENTANYL CITRATE (PF) 250 MCG/5ML IJ SOLN
INTRAMUSCULAR | Status: AC
  Filled 2017-10-09: qty 5

## 2017-10-09 MED ORDER — BUPIVACAINE HCL (PF) 0.25 % IJ SOLN
INTRAMUSCULAR | Status: DC | PRN
  Administered 2017-10-09: 20 mL

## 2017-10-09 MED ORDER — ENOXAPARIN SODIUM 40 MG/0.4ML ~~LOC~~ SOLN
40.0000 mg | SUBCUTANEOUS | Status: AC
  Administered 2017-10-09: 40 mg via SUBCUTANEOUS
  Filled 2017-10-09: qty 0.4

## 2017-10-09 MED ORDER — ONDANSETRON HCL 4 MG/2ML IJ SOLN
INTRAMUSCULAR | Status: DC | PRN
  Administered 2017-10-09 (×2): 4 mg via INTRAVENOUS

## 2017-10-09 MED ORDER — HYDROMORPHONE HCL 2 MG/ML IJ SOLN
INTRAMUSCULAR | Status: AC
  Filled 2017-10-09: qty 1

## 2017-10-09 MED ORDER — METOPROLOL TARTRATE 5 MG/5ML IV SOLN
INTRAVENOUS | Status: AC
  Filled 2017-10-09: qty 5

## 2017-10-09 MED ORDER — KETOROLAC TROMETHAMINE 30 MG/ML IJ SOLN
30.0000 mg | Freq: Four times a day (QID) | INTRAMUSCULAR | Status: DC
  Administered 2017-10-09 – 2017-10-10 (×3): 30 mg via INTRAVENOUS
  Filled 2017-10-09: qty 1

## 2017-10-09 MED ORDER — ENOXAPARIN SODIUM 40 MG/0.4ML ~~LOC~~ SOLN
SUBCUTANEOUS | Status: AC
  Filled 2017-10-09: qty 0.4

## 2017-10-09 MED ORDER — SODIUM CHLORIDE 0.9 % IR SOLN
Status: DC | PRN
  Administered 2017-10-09: 1000 mL

## 2017-10-09 MED ORDER — SODIUM CHLORIDE 0.9 % IV SOLN
INTRAVENOUS | Status: AC
  Filled 2017-10-09: qty 2

## 2017-10-09 MED ORDER — FAMOTIDINE IN NACL 20-0.9 MG/50ML-% IV SOLN
20.0000 mg | Freq: Two times a day (BID) | INTRAVENOUS | Status: DC
  Administered 2017-10-09 (×2): 20 mg via INTRAVENOUS
  Filled 2017-10-09 (×3): qty 50

## 2017-10-09 MED ORDER — FENTANYL CITRATE (PF) 100 MCG/2ML IJ SOLN
INTRAMUSCULAR | Status: DC | PRN
  Administered 2017-10-09 (×5): 50 ug via INTRAVENOUS

## 2017-10-09 MED ORDER — OXYCODONE-ACETAMINOPHEN 5-325 MG PO TABS
1.0000 | ORAL_TABLET | ORAL | Status: DC | PRN
  Administered 2017-10-09 (×2): 1 via ORAL
  Administered 2017-10-09: 2 via ORAL
  Administered 2017-10-10: 1 via ORAL
  Administered 2017-10-10: 2 via ORAL
  Filled 2017-10-09: qty 2

## 2017-10-09 MED ORDER — LIDOCAINE 2% (20 MG/ML) 5 ML SYRINGE
INTRAMUSCULAR | Status: AC
  Filled 2017-10-09: qty 5

## 2017-10-09 MED ORDER — KETOROLAC TROMETHAMINE 30 MG/ML IJ SOLN
INTRAMUSCULAR | Status: AC
  Filled 2017-10-09: qty 1

## 2017-10-09 MED ORDER — METOCLOPRAMIDE HCL 5 MG/ML IJ SOLN
10.0000 mg | Freq: Once | INTRAMUSCULAR | Status: DC | PRN
  Filled 2017-10-09: qty 2

## 2017-10-09 MED ORDER — MORPHINE SULFATE (PF) 2 MG/ML IV SOLN
INTRAVENOUS | Status: AC
  Filled 2017-10-09: qty 1

## 2017-10-09 MED ORDER — OXYCODONE-ACETAMINOPHEN 5-325 MG PO TABS
ORAL_TABLET | ORAL | Status: AC
  Filled 2017-10-09: qty 2

## 2017-10-09 MED ORDER — FENTANYL CITRATE (PF) 100 MCG/2ML IJ SOLN
25.0000 ug | INTRAMUSCULAR | Status: DC | PRN
  Filled 2017-10-09: qty 1

## 2017-10-09 MED ORDER — PROMETHAZINE HCL 25 MG/ML IJ SOLN
INTRAMUSCULAR | Status: AC
  Filled 2017-10-09: qty 1

## 2017-10-09 MED ORDER — LISINOPRIL 20 MG PO TABS
20.0000 mg | ORAL_TABLET | Freq: Every day | ORAL | Status: DC
  Administered 2017-10-09 – 2017-10-10 (×2): 20 mg via ORAL
  Filled 2017-10-09: qty 1

## 2017-10-09 MED ORDER — MEPERIDINE HCL 25 MG/ML IJ SOLN
6.2500 mg | INTRAMUSCULAR | Status: DC | PRN
  Filled 2017-10-09: qty 1

## 2017-10-09 MED ORDER — KETOROLAC TROMETHAMINE 30 MG/ML IJ SOLN
INTRAMUSCULAR | Status: DC | PRN
  Administered 2017-10-09: 30 mg via INTRAVENOUS

## 2017-10-09 MED ORDER — PROMETHAZINE HCL 25 MG/ML IJ SOLN
12.5000 mg | Freq: Four times a day (QID) | INTRAMUSCULAR | Status: DC | PRN
  Administered 2017-10-09: 12.5 mg via INTRAVENOUS
  Filled 2017-10-09: qty 1

## 2017-10-09 MED ORDER — SIMETHICONE 80 MG PO CHEW
80.0000 mg | CHEWABLE_TABLET | Freq: Four times a day (QID) | ORAL | Status: DC | PRN
  Filled 2017-10-09: qty 1

## 2017-10-09 MED ORDER — HYDROCORTISONE NA SUCCINATE PF 100 MG IJ SOLR
INTRAMUSCULAR | Status: DC | PRN
  Administered 2017-10-09: 100 mg via INTRAVENOUS

## 2017-10-09 MED ORDER — ROCURONIUM BROMIDE 100 MG/10ML IV SOLN
INTRAVENOUS | Status: DC | PRN
  Administered 2017-10-09: 70 mg via INTRAVENOUS

## 2017-10-09 MED ORDER — ACETAMINOPHEN 325 MG PO TABS
650.0000 mg | ORAL_TABLET | ORAL | Status: DC | PRN
  Filled 2017-10-09: qty 2

## 2017-10-09 MED ORDER — SODIUM CHLORIDE 0.9 % IV SOLN
2.0000 g | INTRAVENOUS | Status: AC
  Administered 2017-10-09: 2 g via INTRAVENOUS
  Filled 2017-10-09: qty 2

## 2017-10-09 MED ORDER — PROPOFOL 10 MG/ML IV BOLUS
INTRAVENOUS | Status: DC | PRN
  Administered 2017-10-09: 200 mg via INTRAVENOUS

## 2017-10-09 MED ORDER — SUGAMMADEX SODIUM 200 MG/2ML IV SOLN
INTRAVENOUS | Status: DC | PRN
  Administered 2017-10-09: 200 mg via INTRAVENOUS

## 2017-10-09 MED ORDER — OXYCODONE-ACETAMINOPHEN 5-325 MG PO TABS
ORAL_TABLET | ORAL | Status: AC
  Filled 2017-10-09: qty 1

## 2017-10-09 MED ORDER — LIDOCAINE 2% (20 MG/ML) 5 ML SYRINGE
INTRAMUSCULAR | Status: DC | PRN
  Administered 2017-10-09: 80 mg via INTRAVENOUS

## 2017-10-09 MED ORDER — ESMOLOL HCL 100 MG/10ML IV SOLN
INTRAVENOUS | Status: DC | PRN
  Administered 2017-10-09 (×2): 20 mg via INTRAVENOUS

## 2017-10-09 MED ORDER — HYDROCHLOROTHIAZIDE 12.5 MG PO CAPS
12.5000 mg | ORAL_CAPSULE | Freq: Every day | ORAL | Status: DC
  Administered 2017-10-09 – 2017-10-10 (×2): 12.5 mg via ORAL
  Filled 2017-10-09: qty 1

## 2017-10-09 MED ORDER — LACTATED RINGERS IV SOLN
INTRAVENOUS | Status: DC
  Administered 2017-10-09 (×2): via INTRAVENOUS
  Filled 2017-10-09: qty 1000

## 2017-10-09 MED ORDER — PROPOFOL 10 MG/ML IV BOLUS
INTRAVENOUS | Status: AC
  Filled 2017-10-09: qty 20

## 2017-10-09 MED ORDER — MIDAZOLAM HCL 2 MG/2ML IJ SOLN
INTRAMUSCULAR | Status: AC
  Filled 2017-10-09: qty 2

## 2017-10-09 MED ORDER — MENTHOL 3 MG MT LOZG
1.0000 | LOZENGE | OROMUCOSAL | Status: DC | PRN
  Filled 2017-10-09: qty 9

## 2017-10-09 MED ORDER — ROCURONIUM BROMIDE 10 MG/ML (PF) SYRINGE
PREFILLED_SYRINGE | INTRAVENOUS | Status: AC
  Filled 2017-10-09: qty 10

## 2017-10-09 MED ORDER — HYDROMORPHONE HCL 1 MG/ML IJ SOLN
INTRAMUSCULAR | Status: DC | PRN
  Administered 2017-10-09: 0.5 mg via INTRAVENOUS

## 2017-10-09 MED ORDER — KETOROLAC TROMETHAMINE 30 MG/ML IJ SOLN
30.0000 mg | Freq: Four times a day (QID) | INTRAMUSCULAR | Status: DC
  Filled 2017-10-09: qty 1

## 2017-10-09 MED ORDER — DEXTROSE-NACL 5-0.45 % IV SOLN
INTRAVENOUS | Status: DC
  Administered 2017-10-09: 12:00:00 via INTRAVENOUS
  Filled 2017-10-09 (×4): qty 1000

## 2017-10-09 MED ORDER — HYDROCORTISONE NA SUCCINATE PF 100 MG IJ SOLR
50.0000 mg | Freq: Three times a day (TID) | INTRAMUSCULAR | Status: AC
  Administered 2017-10-09 (×2): 50 mg via INTRAVENOUS
  Filled 2017-10-09 (×3): qty 1

## 2017-10-09 MED ORDER — ENOXAPARIN SODIUM 40 MG/0.4ML ~~LOC~~ SOLN
40.0000 mg | SUBCUTANEOUS | Status: DC
  Administered 2017-10-10: 40 mg via SUBCUTANEOUS
  Filled 2017-10-09: qty 0.4

## 2017-10-09 MED ORDER — ONDANSETRON HCL 4 MG/2ML IJ SOLN
INTRAMUSCULAR | Status: AC
  Filled 2017-10-09: qty 2

## 2017-10-09 SURGICAL SUPPLY — 54 items
ADH SKN CLS APL DERMABOND .7 (GAUZE/BANDAGES/DRESSINGS) ×2
APL SRG 38 LTWT LNG FL B (MISCELLANEOUS) ×2
APPLICATOR ARISTA FLEXITIP XL (MISCELLANEOUS) ×2 IMPLANT
CABLE HIGH FREQUENCY MONO STRZ (ELECTRODE) IMPLANT
COVER BACK TABLE 80X110 HD (DRAPES) ×4 IMPLANT
COVER MAYO STAND STRL (DRAPES) ×4 IMPLANT
COVER SURGICAL LIGHT HANDLE (MISCELLANEOUS) IMPLANT
DERMABOND ADVANCED (GAUZE/BANDAGES/DRESSINGS) ×2
DERMABOND ADVANCED .7 DNX12 (GAUZE/BANDAGES/DRESSINGS) ×2 IMPLANT
DILATOR CANAL MILEX (MISCELLANEOUS) IMPLANT
DRSG COVADERM PLUS 2X2 (GAUZE/BANDAGES/DRESSINGS) IMPLANT
DRSG OPSITE POSTOP 3X4 (GAUZE/BANDAGES/DRESSINGS) ×2 IMPLANT
DURAPREP 26ML APPLICATOR (WOUND CARE) ×4 IMPLANT
GAUZE SPONGE 4X4 16PLY XRAY LF (GAUZE/BANDAGES/DRESSINGS) ×4 IMPLANT
GLOVE BIOGEL PI IND STRL 7.0 (GLOVE) ×4 IMPLANT
GLOVE BIOGEL PI INDICATOR 7.0 (GLOVE) ×4
GLOVE ECLIPSE 6.5 STRL STRAW (GLOVE) ×8 IMPLANT
GOWN STRL REUS W/TWL XL LVL3 (GOWN DISPOSABLE) ×8 IMPLANT
HEMOSTAT ARISTA ABSORB 3G PWDR (MISCELLANEOUS) ×2 IMPLANT
LIGASURE VESSEL 5MM BLUNT TIP (ELECTROSURGICAL) ×4 IMPLANT
NEEDLE INSUFFLATION 120MM (ENDOMECHANICALS) ×4 IMPLANT
NS IRRIG 1000ML POUR BTL (IV SOLUTION) ×4 IMPLANT
OCCLUDER COLPOPNEUMO (BALLOONS) ×4 IMPLANT
PACK LAPAROSCOPY BASIN (CUSTOM PROCEDURE TRAY) ×4 IMPLANT
PACK TRENDGUARD 450 HYBRID PRO (MISCELLANEOUS) ×2 IMPLANT
POUCH LAPAROSCOPIC INSTRUMENT (MISCELLANEOUS) ×4 IMPLANT
PROTECTOR NERVE ULNAR (MISCELLANEOUS) ×8 IMPLANT
SCISSORS LAP 5X35 DISP (ENDOMECHANICALS) IMPLANT
SET IRRIG TUBING LAPAROSCOPIC (IRRIGATION / IRRIGATOR) ×4 IMPLANT
SET IRRIG Y TYPE TUR BLADDER L (SET/KITS/TRAYS/PACK) ×4 IMPLANT
SET TRI-LUMEN FLTR TB AIRSEAL (TUBING) ×4 IMPLANT
SHEARS HARMONIC ACE PLUS 36CM (ENDOMECHANICALS) ×4 IMPLANT
SOLUTION ELECTROLUBE (MISCELLANEOUS) IMPLANT
SUT VIC AB 0 CT1 27 (SUTURE) ×8
SUT VIC AB 0 CT1 27XBRD ANBCTR (SUTURE) ×4 IMPLANT
SUT VICRYL 0 UR6 27IN ABS (SUTURE) IMPLANT
SUT VICRYL 4-0 PS2 18IN ABS (SUTURE) ×4 IMPLANT
SUT VLOC 180 0 9IN  GS21 (SUTURE) ×2
SUT VLOC 180 0 9IN GS21 (SUTURE) ×2 IMPLANT
SYR 10ML LL (SYRINGE) ×4 IMPLANT
SYR 50ML LL SCALE MARK (SYRINGE) ×8 IMPLANT
SYSTEM CARTER THOMASON II (TROCAR) IMPLANT
TIP UTERINE 5.1X6CM LAV DISP (MISCELLANEOUS) IMPLANT
TIP UTERINE 6.7X10CM GRN DISP (MISCELLANEOUS) ×2 IMPLANT
TIP UTERINE 6.7X6CM WHT DISP (MISCELLANEOUS) IMPLANT
TIP UTERINE 6.7X8CM BLUE DISP (MISCELLANEOUS) IMPLANT
TOWEL OR 17X24 6PK STRL BLUE (TOWEL DISPOSABLE) ×8 IMPLANT
TRAY FOLEY W/BAG SLVR 14FR (SET/KITS/TRAYS/PACK) ×4 IMPLANT
TRENDGUARD 450 HYBRID PRO PACK (MISCELLANEOUS) ×4
TROCAR ADV FIXATION 5X100MM (TROCAR) ×4 IMPLANT
TROCAR PORT AIRSEAL 5X120 (TROCAR) ×4 IMPLANT
TROCAR XCEL NON BLADE 8MM B8LT (ENDOMECHANICALS) ×4 IMPLANT
TROCAR XCEL NON-BLD 5MMX100MML (ENDOMECHANICALS) ×4 IMPLANT
WARMER LAPAROSCOPE (MISCELLANEOUS) ×4 IMPLANT

## 2017-10-09 NOTE — Anesthesia Preprocedure Evaluation (Signed)
Anesthesia Evaluation  Patient identified by MRN, date of birth, ID band Patient awake    Reviewed: Allergy & Precautions, NPO status , Patient's Chart, lab work & pertinent test results  Airway Mallampati: II  TM Distance: >3 FB Neck ROM: Full    Dental no notable dental hx.    Pulmonary neg pulmonary ROS, former smoker,    Pulmonary exam normal breath sounds clear to auscultation       Cardiovascular hypertension, Pt. on medications Normal cardiovascular exam Rhythm:Regular Rate:Normal     Neuro/Psych negative neurological ROS  negative psych ROS   GI/Hepatic Neg liver ROS, UC   Endo/Other  negative endocrine ROS  Renal/GU negative Renal ROS  negative genitourinary   Musculoskeletal negative musculoskeletal ROS (+)   Abdominal   Peds negative pediatric ROS (+)  Hematology negative hematology ROS (+)   Anesthesia Other Findings   Reproductive/Obstetrics negative OB ROS                             Anesthesia Physical Anesthesia Plan  ASA: II  Anesthesia Plan: General   Post-op Pain Management:    Induction: Intravenous  PONV Risk Score and Plan: 4 or greater and Ondansetron, Dexamethasone, Midazolam and Scopolamine patch - Pre-op  Airway Management Planned: Oral ETT  Additional Equipment:   Intra-op Plan:   Post-operative Plan: Extubation in OR  Informed Consent: I have reviewed the patients History and Physical, chart, labs and discussed the procedure including the risks, benefits and alternatives for the proposed anesthesia with the patient or authorized representative who has indicated his/her understanding and acceptance.   Dental advisory given  Plan Discussed with: CRNA  Anesthesia Plan Comments:         Anesthesia Quick Evaluation

## 2017-10-09 NOTE — Progress Notes (Signed)
Day of Surgery Procedure(s) (LRB): TOTAL LAPAROSCOPIC HYSTERECTOMY WITH SALPINGECTOMY (Bilateral) CYSTOSCOPY (N/A)  Subjective: Patient reports good pain control.  Nausea has resolved.  Denies vaginal bleeding.  Has walked, eaten and been able to void.  Questions about surgery answered.  Pictures shown to pt.  Objective: I have reviewed patient's vital signs, intake and output, medications and labs. Vitals:   10/09/17 1041 10/09/17 1105 10/09/17 1203 10/09/17 1609  BP:  (!) 155/99 (!) 146/94 (!) 143/92  Pulse: 80 70 67 96  Resp: 16 16 18 16   Temp: (!) 97.2 F (36.2 C) (!) 97 F (36.1 C) (!) 97.1 F (36.2 C) 97.6 F (36.4 C)  TempSrc:      SpO2: 100% 100% 99% 100%  Weight:      Height:       UOP:  650 since surgery plus additional void  General: alert, cooperative and no distress Resp: clear to auscultation bilaterally Cardio: regular rate and rhythm, S1, S2 normal, no murmur, click, rub or gallop GI: soft, non-tender; bowel sounds normal; no masses,  no organomegaly and incision: clean, dry and intact Extremities: extremities normal, atraumatic, no cyanosis or edema Vaginal Bleeding: none  Assessment: s/p Procedure(s) with comments: TOTAL LAPAROSCOPIC HYSTERECTOMY WITH SALPINGECTOMY (Bilateral) - need extended recovery bed CYSTOSCOPY (N/A) - possible cysto: stable and progressing well  Plan: Advance diet Encourage ambulation Advance to PO medication  CBC in AM  LOS: 0 days    Megan Salon 10/09/2017, 6:53 PM

## 2017-10-09 NOTE — Transfer of Care (Signed)
Immediate Anesthesia Transfer of Care Note  Patient: Barbara Poole  Procedure(s) Performed: TOTAL LAPAROSCOPIC HYSTERECTOMY WITH SALPINGECTOMY (Bilateral Abdomen) CYSTOSCOPY (N/A )  Patient Location: PACU  Anesthesia Type:General  Level of Consciousness: drowsy  Airway & Oxygen Therapy: Patient Spontanous Breathing and Patient connected to nasal cannula oxygen  Post-op Assessment: Report given to RN and Post -op Vital signs reviewed and stable  Post vital signs: Reviewed and stable  Last Vitals:  Vitals Value Taken Time  BP 148/98 10/09/2017  9:35 AM  Temp    Pulse 65 10/09/2017  9:37 AM  Resp 12 10/09/2017  9:37 AM  SpO2 100 % 10/09/2017  9:37 AM  Vitals shown include unvalidated device data.  Last Pain:  Vitals:   10/09/17 0551  TempSrc: Oral         Complications: No apparent anesthesia complications

## 2017-10-09 NOTE — Anesthesia Procedure Notes (Signed)
Procedure Name: Intubation Date/Time: 10/09/2017 7:37 AM Performed by: Gwyndolyn Saxon, CRNA Pre-anesthesia Checklist: Patient identified, Emergency Drugs available, Suction available and Patient being monitored Patient Re-evaluated:Patient Re-evaluated prior to induction Oxygen Delivery Method: Circle system utilized Preoxygenation: Pre-oxygenation with 100% oxygen Induction Type: IV induction Ventilation: Mask ventilation without difficulty Laryngoscope Size: Miller and 2 Grade View: Grade II Tube type: Oral Tube size: 7.0 mm Number of attempts: 1 Airway Equipment and Method: Patient positioned with wedge pillow Placement Confirmation: ETT inserted through vocal cords under direct vision,  positive ETCO2,  CO2 detector and breath sounds checked- equal and bilateral Secured at: 21 cm Tube secured with: Tape Dental Injury: Teeth and Oropharynx as per pre-operative assessment

## 2017-10-09 NOTE — H&P (Signed)
Barbara Poole is an 42 y.o. female G66P5A1 SAAF here for definitive treatment of menorrhagia and fibroid uterus.  Pt did have a Mirena IUD that was being used for treatment of her bleeding. This was removed due to continued bleeding and then her bleeding significantly worsened.  She does not want to be on any other hormonal methods and is desirous of definitive treatment.  Most recent ultrasound showed uterus to measure 9.5 x 5.0 x 8.1 with several fibroids measuring up to 2.5cm.  Ovaries were normal.  Endometrial biopsy was negative as well.  Risks, benefits, alteratives have been dicussed.  Pt here and ready to proceed.  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 Abnormal uterine bleeding   . Anemia   . Fibroid   . Headache    migraines  . Hypertension   . STD (sexually transmitted disease)    HSV-Age 54  . Ulcerative colitis ([place]) 06/2009   does not take meds    Past Surgical History:  Procedure Laterality Date  . TONSILLECTOMY    . TUBAL LIGATION  2008   post partum  . WISDOM TOOTH EXTRACTION      Family History  Problem Relation Age of Onset  . Hyperlipidemia Father   . Hypertension Father   . Crohn's disease Son   . Diabetes Maternal Uncle   . Diabetes Paternal Aunt   . Hypertension Maternal Grandmother   . Diabetes Paternal Grandmother     Social History:  reports that she quit smoking about 14 years ago. She has never used smokeless tobacco. She reports that she drinks about 1.0 standard drinks of alcohol per week. She reports that she has current or past drug history. Drug: Marijuana.  Allergies: No Known Allergies  Medications Prior to Admission   Medication Sig Dispense Refill Last Dose  . acyclovir (ZOVIRAX) 400 MG tablet Take 400 mg by mouth 2 (two) times daily.    10/07/2017 at Unknown time  . hydrochlorothiazide (HYDRODIURIL) 12.5 MG tablet Take 12.5 mg by mouth daily.   1 10/08/2017 at Unknown time  . lisinopril (PRINIVIL,ZESTRIL) 20 MG tablet Take 20 mg by mouth daily.    10/08/2017 at 0800  . norethindrone (AYGESTIN) 5 MG tablet Take 2 tablets (10 mg total) by mouth daily. 60 tablet 0 10/08/2017 at Unknown time  . predniSONE (DELTASONE) 10 MG tablet Take 10 mg by mouth 2 (two) times daily.    10/08/2017 at Unknown time  . Multiple Vitamin (MULTIVITAMIN WITH MINERALS) TABS tablet Take 1 tablet by mouth daily.   10/07/2017    Review of Systems  Musculoskeletal:       Right calf aches the past two days  All other systems reviewed and are negative.   Blood pressure (!) 150/88, pulse 81, temperature 99.5 F (37.5 C), temperature source Oral, resp. rate 17, height 5' 5"  (1.651 m), weight 83.5 kg, SpO2 99 %. Physical Exam  Constitutional: She is oriented to person, place, and time. She appears well-developed and well-nourished.  Cardiovascular: Normal rate and regular rhythm.  Respiratory: Effort normal and breath sounds normal.  Neurological: She is alert and oriented to person, place, and time.  Skin: Skin is warm and dry.  Psychiatric: She has a normal mood  and affect.    Results for orders placed or performed during the hospital encounter of 10/09/17 (from the past 24 hour(s))  Pregnancy, urine POC     Status: None   Collection Time: 10/09/17  5:59 AM  Result Value Ref Range   Preg Test, Ur NEGATIVE NEGATIVE    No results found.  Assessment/Plan: 42 yo G6P5 SAAF here for definitive treatment of menorrhagia and fibroids uterus with TLH/Bilteral salpingectomy/possible BSO, cystoscopy.  Procedure reviewed.  Questions answered.  Pt ready to proceed.  Megan Salon 10/09/2017, 7:14 AM

## 2017-10-09 NOTE — Op Note (Signed)
10/09/2017  9:49 AM  PATIENT:  Barbara Poole  42 y.o. female  PRE-OPERATIVE DIAGNOSIS:  menorrhagia, enlarging uterine fibroids  POST-OPERATIVE DIAGNOSIS:  menorrhagia, enlarging uterine fibroids  PROCEDURE:  Procedure(s): TOTAL LAPAROSCOPIC HYSTERECTOMY WITH BILATERAL SALPINGECTOMY CYSTOSCOPY  SURGEON:  Megan Salon  ASSISTANTS: Sumner Boast, MD   ANESTHESIA:   general  ESTIMATED BLOOD LOSS: 25cc  BLOOD ADMINISTERED:none   FLUIDS: 1200cc LR  UOP: 250cc clear UOP  SPECIMEN:  Uterus, cervix, bilateral fallopian tubes  DISPOSITION OF SPECIMEN:  PATHOLOGY  FINDINGS: globular appearing uterus with multiple fibroids, normal ovaries and normal fallopian tubes with prior BTL so a portion of each tube was missing, normal appendix and normal upper abdomen  DESCRIPTION OF OPERATION: Patient is taken to the operating room. She is placed in the supine position. She is a running IV in place. Informed consent was present on the chart. SCDs on her lower extremities and functioning properly. General endotracheal anesthesia was administered by the anesthesia staff without difficulty.  Patient was positioned with her legs in the low lithotomy position in [place]. Her arms were tucked by the side.  Time out performed.    Chlora prep was then used to prep the abdomen and Betadine was used to prep the inner thighs, perineum and vagina. Once 3 minutes had past the patient was draped in a normal standard fashion. The legs were lifted to the high lithotomy position. The cervix was visualized by placing a heavy weighted speculum in the posterior aspect of the vagina and using a curved Deaver retractor to the retract anteriorly. The anterior lip of the cervix was grasped with single-tooth tenaculum.  The cervix sounded to 10 cm. Pratt dilators were used to dilate the cervix up to a #21. A RUMI uterine manipulator was obtained. A # 10disposable tip was placed on the RUMI manipulator as well as a  4.0, silver KOH ring. This was passed through the cervix and the bulb of the disposable tip was inflated with 10 cc of normal saline. There was a good fit of the KOH ring around the cervix. The tenaculum was removed. There is also good manipulation of the uterus. The speculum and retractor were removed as well. A Foley catheter was placed to straight drain.  Clear urine was noted. Legs were lowered to the low lithotomy position and attention was turned the abdomen.  The umbilicus was everted.  A Veress needle was obtained. Syringe of sterile saline was placed on a open Veress needle.  This was passed into the umbilicus until just when the fluid started to drip.  Then low flow CO2 gas was attached the needle and the pneumoperitoneum was achieved without difficulty. Once four liters of gas was in the abdomen the Veress needle was removed and a 5 millimeter non-bladed Optiview trocar and port were passed directly to the abdomen. The laparoscope was then used to confirm intraperitoneal placement. An enlarged uterus with multiple small fibroids was noted.  Ovaries were normal.  Evidence of prior BTL was noted with portion of each tube missing.  Locations for RLQ, LLQ, and suprapubic ports were noted by transillumination of the abdominal wall.  0.25% marcaine was used to anesthetize the skin.  49m skin incision was made in the RLQ and an AirSeal port was placed underdirect visualization of the laparoscope.  Then a 563mskin incision was made and a 54m754monbladed trochar and port was placed in the LLQ.  Finally, and 8mm79min incision was made about 4cm  above the pubic symphasis and an 52m non-bladed port was placed with direct visualization of the laparoscope.  All trochars were removed.    Ureters were identifies.  Attention was turned to the left side. With uterus on stretch the left tube was excised off the ovary and mesosalpinx was dissected to free the tube. Then the left utero-ovarian pedicle was serially clamped  cauterized and incised using the ligasure device. Left round ligament was serially clamped cauterized and incised. The anterior and posterior peritoneum of the inferior leaf of the broad ligament were opened. The beginning of the baldder flap was created.  The bladder was taken down below the level of the KOH ring. The left uterine artery was skeletonized and then just superior to the KOH ring this vessel was serially clamped, cauterized, and incised.  Attention was turned the right side.  The uterus was placed on stretch to the opposite side.  The tube was excised off the ovary using sharp dissection a bipolar cautery.  The mesosalpinx was incised freeing the tube. Then the right uterine ovarian pedicle was serially clamped cauterized and incised. Next the right round ligament was serially clamped cauterized and incised. The anterior posterior peritoneum of the inferiorly for the broad ligament were opened. The anterior peritoneum was carried across to the dissection on the left side. The remainder of the bladder flap was created using sharp dissection. The bladder was well below the level of the KOH ring. The left uterine artery skeletonized. Then the left uterine artery, above the level of the KOH ring, was serially clamped cauterized and incised. The uterus was devascularized at this point.  The colpotomy was performed a starting in the midline and using a harmonic scalpel with the inferior edge of the open blade  This was carried around a circumferential fashion until the vaginal mucosa was completely incised in the specimen was freed.  The specimen was then delivered to the vagina.  A vaginal occlusive device was used to maintain the pneumoperitoneum.  Instruments were changed with a needle driver and Kobra graspers.  Using a 9 inch V. lock suture, the cuff was closed by incorporating the anterior and posterior vaginal mucosa in each stitch. This was carried across all the way to the left corner in a  running fashion. Two stitches were brought back towards the midline and the suture was cut flush with the vagina. The needle was brought out the pelvis. The pelvis was irrigated. All pedicles were inspected. No bleeding was noted.  CO2 gas pressures were decreased to watch for any bleeding.  Pressure was decreased to 574mHg.  No bleeding was noted.  Arista was placed along the pelvis at all pedicles.  Again, no bleeding was noted.  Ureters were noted deep in the pelvis to be peristalsing.  At this point the procedure was completed.  All instruments were removed.  Then the suprapubic port was removed under direct visualization.  The pneumoperitoneum was relieved.  The patient was taken out of Trendelenburg positioning.  Several deep breaths were given to the patient's trying to any gas the abdomen and finally the midline port, RLQ and LLQ ports were removed.  The skin was then closed with subcuticular stitches of 3-0 Vicryl. The skin was cleansed Dermabond was applied. Attention was then turned the vagina and the cuff was inspected. No bleeding was noted. The anterior posterior vaginal mucosa was incorporated in each stitch. The Foley catheter was removed.  Cystoscopy was performed.  No sutures or bladder  injuries were noted.  Entire bladder was seen including the bubble on the dome of the bladder.  Ureters were noted with normal urine jets from each one was seen.  Foley was left out after the cystoscopic fluid was drained and cystoscope removed.  Sponge, lap, needle, initially counts were correct x2. Patient tolerated the procedure very well. She was awakened from anesthesia, extubated and taken to recovery in stable condition.   COUNTS:  YES  PLAN OF CARE: Transfer to PACU

## 2017-10-09 NOTE — Anesthesia Postprocedure Evaluation (Signed)
Anesthesia Post Note  Patient: Barbara Poole  Procedure(s) Performed: TOTAL LAPAROSCOPIC HYSTERECTOMY WITH SALPINGECTOMY (Bilateral Abdomen) CYSTOSCOPY (N/A )     Patient location during evaluation: PACU Anesthesia Type: General Level of consciousness: awake and alert Pain management: pain level controlled Vital Signs Assessment: post-procedure vital signs reviewed and stable Respiratory status: spontaneous breathing, nonlabored ventilation, respiratory function stable and patient connected to nasal cannula oxygen Cardiovascular status: blood pressure returned to baseline and stable Postop Assessment: no apparent nausea or vomiting Anesthetic complications: no    Last Vitals:  Vitals:   10/09/17 1105 10/09/17 1203  BP: (!) 155/99 (!) 146/94  Pulse: 70 67  Resp: 16 18  Temp: (!) 36.1 C (!) 36.2 C  SpO2: 100% 99%    Last Pain:  Vitals:   10/09/17 1203  TempSrc:   PainSc: 4                  Montez Hageman

## 2017-10-10 ENCOUNTER — Ambulatory Visit (HOSPITAL_BASED_OUTPATIENT_CLINIC_OR_DEPARTMENT_OTHER): Payer: BLUE CROSS/BLUE SHIELD

## 2017-10-10 DIAGNOSIS — N800 Endometriosis of uterus: Secondary | ICD-10-CM | POA: Diagnosis not present

## 2017-10-10 DIAGNOSIS — M79609 Pain in unspecified limb: Secondary | ICD-10-CM | POA: Diagnosis not present

## 2017-10-10 LAB — CBC
HCT: 38 % (ref 36.0–46.0)
Hemoglobin: 12.1 g/dL (ref 12.0–15.0)
MCH: 28.9 pg (ref 26.0–34.0)
MCHC: 31.8 g/dL (ref 30.0–36.0)
MCV: 90.7 fL (ref 78.0–100.0)
Platelets: 372 10*3/uL (ref 150–400)
RBC: 4.19 MIL/uL (ref 3.87–5.11)
RDW: 13 % (ref 11.5–15.5)
WBC: 11.9 10*3/uL — AB (ref 4.0–10.5)

## 2017-10-10 MED ORDER — OXYCODONE-ACETAMINOPHEN 5-325 MG PO TABS
ORAL_TABLET | ORAL | Status: AC
  Filled 2017-10-10: qty 2

## 2017-10-10 MED ORDER — KETOROLAC TROMETHAMINE 30 MG/ML IJ SOLN
INTRAMUSCULAR | Status: AC
  Filled 2017-10-10: qty 1

## 2017-10-10 MED ORDER — ENOXAPARIN SODIUM 40 MG/0.4ML ~~LOC~~ SOLN
SUBCUTANEOUS | Status: AC
  Filled 2017-10-10: qty 0.4

## 2017-10-10 MED ORDER — IBUPROFEN 800 MG PO TABS: 800.0000 mg | ORAL_TABLET | Freq: Three times a day (TID) | ORAL | 0 refills | Status: DC | PRN

## 2017-10-10 MED ORDER — PREDNISONE 10 MG PO TABS: ORAL_TABLET | ORAL | 0 refills | Status: DC

## 2017-10-10 MED ORDER — OXYCODONE-ACETAMINOPHEN 5-325 MG PO TABS: 1.0000 | ORAL_TABLET | Freq: Four times a day (QID) | ORAL | 0 refills | Status: DC | PRN

## 2017-10-10 NOTE — Progress Notes (Signed)
1 Day Post-Op Procedure(s) (LRB): TOTAL LAPAROSCOPIC HYSTERECTOMY WITH SALPINGECTOMY (Bilateral) CYSTOSCOPY (N/A)  Subjective: Patient reports no nausea.  Having a lot of gas pain.  Eating regular diet.  Walking.  Voiding without difficulty.  Has passed a little gas.  Having right calf pain.  Worse with walking.  Feels achy.   Objective: I have reviewed patient's vital signs, intake and output, medications and labs. Vitals:   10/09/17 1609 10/09/17 1950 10/09/17 2331 10/10/17 0328  BP: (!) 143/92 140/79 111/73 123/72  Pulse: 96  85 66  Resp: 16 16 16 16   Temp: 97.6 F (36.4 C) 98.8 F (37.1 C) 98.9 F (37.2 C) 98.6 F (37 C)  TempSrc:      SpO2: 100% 97% 100% 99%  Weight:      Height:       Post op Hb:  12.1   General: alert, cooperative and no distress Resp: clear to auscultation bilaterally Cardio: regular rate and rhythm, S1, S2 normal, no murmur, click, rub or gallop GI: soft, mildly tender, moderate distension, normal bowel sounds Extremities: extremities normal, atraumatic, no cyanosis or edema and no pain with palpation of right calf Vaginal Bleeding: none  Inc:  C/D/I  Assessment: s/p Procedure(s) with comments: TOTAL LAPAROSCOPIC HYSTERECTOMY WITH SALPINGECTOMY (Bilateral) - need extended recovery bed CYSTOSCOPY (N/A) - possible cysto: stable, progressing well and right calf pain  Plan: Will get right LE doppler.  If negative, plan d/c later today. Give repeat Lovenox dose this am.  LOS: 0 days    Megan Salon 10/10/2017, 7:12 AM

## 2017-10-10 NOTE — Discharge Instructions (Signed)
Post Op Hysterectomy Instructions Please read the instructions below. Refer to these instructions for the next few weeks. These instructions provide you with general information on caring for yourself after surgery. Your caregiver may also give you specific instructions. While your treatment has been planned according to the most current medical practices available, unavoidable problems sometimes happen. If you have any problems or questions after you leave, please call your caregiver.  HOME CARE INSTRUCTIONS Healing will take time. You will have discomfort, tenderness, swelling and bruising at the operative site for a couple of weeks. This is normal and will get better as time goes on.   Only take over-the-counter or prescription medicines for pain, discomfort or fever as directed by your caregiver.   Do not take aspirin. It can cause bleeding.   Do not drive when taking pain medication.   Follow your caregivers advice regarding diet, exercise, lifting, driving and general activities.   Resume your usual diet as directed and allowed.   Get plenty of rest and sleep.   Do not douche, use tampons, or have sexual intercourse until your caregiver gives you permission. .   Take your temperature if you feel hot or flushed.   You may shower today when you get home.  No tub bath for one week.    Do not drink alcohol until you are not taking any narcotic pain medications.   Try to have someone home with you for a week or two to help with the household activities.   Be careful over the next two to three weeks with any activities at home that involve lifting, pushing, or pulling.  Listen to your body--if something feels uncomfortable to do, then don't do it.  Make sure you and your family understands everything about your operation and recovery.   Walking up stairs is fine.  Do not sign any legal documents until you feel normal again.   Keep all your follow-up appointments as recommended by  your caregiver.   PLEASE CALL THE OFFICE IF:  There is swelling, redness or increasing pain in the wound area.   Pus is coming from the wound.   You notice a bad smell from the wound or surgical dressing.   You have pain, redness and swelling from the intravenous site.   The wound is breaking open (the edges are not staying together).    You develop pain or bleeding when you urinate.   You develop abnormal vaginal discharge.   You have any type of abnormal reaction or develop an allergy to your medication.   You need stronger pain medication for your pain   SEEK IMMEDIATE MEDICAL CARE:  You develop a temperature of 100.5 or higher.   You develop abdominal pain.   You develop chest pain.   You develop shortness of breath.   You pass out.   You develop pain, swelling or redness of your leg.   You develop heavy vaginal bleeding with or without blood clots.   MEDICATIONS:  Restart your regular medications BUT wait one week before restarting all vitamins and mineral supplements OR until you are having regular bowel movements.  Use Motrin 893m every 8 hours for the next several days.  This will help you use less Percocet.  Use the Percocet 5/325 1-2 tabs every 4-6 hours as needed for pain.  You may use an over the counter stool softener like Colace or Dulcolax to help with starting a bowel movement.  Start the day after you go  home.  Warm liquids, fluids, and ambulation help too.  If you have not had a bowel movement in four days, you need to call the office.  You need to complete a steroid taper due to receiving IV stress dosed steroids yesterday.  You received 80m Prednisone this morning.  You should take an additional 125m12 hours after the morning dose.  Tomorrow (Wednesday), you need to take 1029mn the morning and then 5mg61m hours later.  On Thursday, resume your normal 10mg88mage.

## 2017-10-10 NOTE — Progress Notes (Signed)
Right lower extremity venous duplex has been completed. Negative for DVT. Results were given to the patient's nurse, Kendell.   10/10/17 8:35 AM Barbara Poole RVT

## 2017-10-10 NOTE — Discharge Summary (Signed)
Physician Discharge Summary  Patient ID: Barbara Poole MRN: [id number] DOB/AGE: 05/05/1975 42 y.o.  Admit date: 10/09/2017 Discharge date: 10/10/2017  Admission Diagnoses:  Enlarged fibroid uterus, menorrhagia, failed Mirena IUD use, iron deficiency, Ulcerative colitis, hypertension  Discharge Diagnoses:  Active Problems:   * No active hospital problems. *  Discharged Condition: stable  Hospital Course: Patient admitted through same day surgery.  She was taken to OR where TLH/bilateral salpingectomy, cystoscopy were performed.  Surgical findings included enlarged, fibroid uterus with normal ovaries.  Surgery was uneventful.  EBL 25cc.  Foley catheter was removed before leaving OR.  Patient transferred to PACU where she was stable and then to 3rd floor for the remainder of her hospitalization.  During her post-op recovery, her vitals and stable and she was AF.  In evening of POD#0, she was able to transition to oral pain medications and regular diet.  She was able to ambulate and she had good pain control.  She was also able to void on her own.  Patient seen both in the evening of POD#0 and AM of POD#1.  In the AM of POD#1, she was was complaining of some gas pain as well as some ache in her right calf.  There was a negative Homan's sign, no erythema and no LE edema.  However, a right LE doppler was ordered.  This was negative for DVT.  Pt has received DVT prophylaxis with Lovenox 24m sq before the surgery and then repeated 24 hours later.  Post op hb was 12.1, decreased from 12.5, pre-operatively.  At this point, patient was voiding, walking, having excellent pain control, had no nausea, and no vaginal bleeding.  She was ready for D/C.  Consults: None  Significant Diagnostic Studies: labs: post op hb 12.1 and LE doppler that was negative for DVT  Treatments: surgery: TLH/bilateral salpingectomy/cystoscopy  Discharge Exam: Blood pressure 123/72, pulse 66, temperature 98.6 F (37 C), resp.  rate 16, height 5' 5"  (1.651 m), weight 83.5 kg, SpO2 99 %. General appearance: alert, cooperative and no distress Resp: clear to auscultation bilaterally Cardio: regular rate and rhythm, S1, S2 normal, no murmur, click, rub or gallop GI: normal findings: soft, mildly distended, mildly tender to palpation but approrpiate, normal bowel sounds Extremities: extremities normal, atraumatic, no cyanosis or edema Incision/Wound: c/d/i  Vaginal bleeding:  none  Disposition: Discharge disposition: 01-Home or Self Care       Discharge Instructions    Call MD for:   Complete by:  As directed    Heavy vaginal bleeding, like a menstrual cycle   Call MD for:  persistant nausea and vomiting   Complete by:  As directed    Call MD for:  redness, tenderness, or signs of infection (pain, swelling, redness, odor or green/yellow discharge around incision site)   Complete by:  As directed    Call MD for:  severe uncontrolled pain   Complete by:  As directed    Call MD for:  temperature >100.4   Complete by:  As directed    Diet general   Complete by:  As directed    Discharge wound care:   Complete by:  As directed    Use soap and water, only, on incisions.   Driving Restrictions   Complete by:  As directed    May drive in 5-7 days if you are off the narcotics during the day and feel like you can control your car adequately.   Increase activity slowly   Complete  by:  As directed    You can climb stairs.  Be careful of any heavy lifting.   Lifting restrictions   Complete by:  As directed    No heavy lifting (>15 pounds) for 4 weeks   May shower / Bathe   Complete by:  As directed    May shower on post-operative day 1.  May take a tub bath in one week.   Other Restrictions   Complete by:  As directed    Return to work as discussed with Dr. Sabra Heck   Remove dressing in 24 hours   Complete by:  As directed    Sexual Activity Restrictions   Complete by:  As directed    Nothing in the vagina  for AT LEAST 8 weeks, intercourse/tampons/douching     Allergies as of 10/10/2017   No Known Allergies     Medication List    STOP taking these medications   norethindrone 5 MG tablet Commonly known as:  AYGESTIN     TAKE these medications   acyclovir 400 MG tablet Commonly known as:  ZOVIRAX Take 400 mg by mouth 2 (two) times daily.   hydrochlorothiazide 12.5 MG tablet Commonly known as:  HYDRODIURIL Take 12.5 mg by mouth daily.   ibuprofen 800 MG tablet Commonly known as:  ADVIL,MOTRIN Take 1 tablet (800 mg total) by mouth every 8 (eight) hours as needed.   lisinopril 20 MG tablet Commonly known as:  PRINIVIL,ZESTRIL Take 20 mg by mouth daily.   multivitamin with minerals Tabs tablet Take 1 tablet by mouth daily.   oxyCODONE-acetaminophen 5-325 MG tablet Commonly known as:  PERCOCET/ROXICET Take 1-2 tablets by mouth every 6 (six) hours as needed for moderate pain or severe pain.   predniSONE 10 MG tablet Commonly known as:  DELTASONE Prednisone taper:  You received a stress dose of steroids yesterday and 2m prednisone this morning.  You need to take 157mof prednisone again 12 hours after the morning dose.  Tomorrow (Wednesday), take 1023mn the morning and then 5mg12m hours later.  On Thursday, start taking your regular 10mg68mly. What changed:    how much to take  how to take this  when to take this  additional instructions            Discharge Care Instructions  (From admission, onward)         Start     Ordered   10/10/17 0000  Discharge wound care:    Comments:  Use soap and water, only, on incisions.   10/10/17 0856         Follow-up Information    Barbara SalonFollow up.   Specialty:  Gynecology Why:  has one week post op appt already Contact information: [phone]           Signed: Loren Sawaya Megan Poole/2019, 8:59 AM

## 2017-10-11 ENCOUNTER — Encounter (HOSPITAL_BASED_OUTPATIENT_CLINIC_OR_DEPARTMENT_OTHER): Payer: Self-pay | Admitting: Obstetrics & Gynecology

## 2017-10-16 ENCOUNTER — Ambulatory Visit (INDEPENDENT_AMBULATORY_CARE_PROVIDER_SITE_OTHER): Payer: BLUE CROSS/BLUE SHIELD | Admitting: Obstetrics & Gynecology

## 2017-10-16 ENCOUNTER — Encounter: Payer: Self-pay | Admitting: Obstetrics & Gynecology

## 2017-10-16 VITALS — BP 120/78 | HR 88 | Resp 16 | Ht 65.0 in | Wt 182.0 lb

## 2017-10-16 DIAGNOSIS — Z9889 Other specified postprocedural states: Secondary | ICD-10-CM

## 2017-10-16 MED ORDER — OXYCODONE-ACETAMINOPHEN 5-325 MG PO TABS: 1.0000 | ORAL_TABLET | Freq: Four times a day (QID) | ORAL | 0 refills | Status: DC | PRN

## 2017-10-16 NOTE — Progress Notes (Signed)
Post Operative Visit  Procedure: Total Laparoscopic Hysterectomy with Salpingectomy, Cystoscopy.  Days Post-op: 7  Subjective: Doing well.  No vaginal bleeding.  Getting around easily.  Energy is decreased but expected.  No fever.  Having some constipation.  Hasn't taken a Percocet in last 2 days.  Relationship between constipation and narcotics discussed.  Pt is going to try alternating tylenol and motrin if needed for pain/soreness.  Emptying bladder normally.  Objective: BP 120/78 (BP Location: Right Arm, Patient Position: Sitting, Cuff Size: Large)   Pulse 88   Resp 16   Ht 5' 5"  (1.651 m)   Wt 182 lb (82.6 kg)   LMP 07/17/2017   BMI 30.29 kg/m   EXAM General: alert, cooperative and no distress Resp: clear to auscultation bilaterally Cardio: regular rate and rhythm, S1, S2 normal, no murmur, click, rub or gallop GI: soft, non-tender; bowel sounds normal; no masses,  no organomegaly and incision: clean, dry and intact Extremities: extremities normal, atraumatic, no cyanosis or edema Vaginal Bleeding: none  Assessment: s/p TLH/bilateral salpingectomy/cystoscopy H/O UC on chronic steroids, completed steroid taper post op after receiving stress dosed steroids.  Plan: Recheck 3 weeks.  Appt already scheduled.

## 2017-11-20 ENCOUNTER — Other Ambulatory Visit: Payer: Self-pay | Admitting: Obstetrics & Gynecology

## 2017-11-20 NOTE — Telephone Encounter (Signed)
Medication refill request: ibuprofen 863m Last OV: 10/16/17    Next OV: 11/26/17 Last MMG (if hormonal medication request): none on file  Refill authorized: #30 with 0 RF

## 2017-11-26 ENCOUNTER — Telehealth: Payer: Self-pay | Admitting: Obstetrics & Gynecology

## 2017-11-26 ENCOUNTER — Encounter (HOSPITAL_BASED_OUTPATIENT_CLINIC_OR_DEPARTMENT_OTHER): Payer: Self-pay

## 2017-11-26 ENCOUNTER — Ambulatory Visit: Payer: BLUE CROSS/BLUE SHIELD | Admitting: Obstetrics & Gynecology

## 2017-11-26 ENCOUNTER — Other Ambulatory Visit: Payer: Self-pay

## 2017-11-26 ENCOUNTER — Emergency Department (HOSPITAL_BASED_OUTPATIENT_CLINIC_OR_DEPARTMENT_OTHER)
Admission: EM | Admit: 2017-11-26 | Discharge: 2017-11-26 | Disposition: A | Discharge status: Home / Self Care | Payer: BLUE CROSS/BLUE SHIELD | Attending: Emergency Medicine | Admitting: Emergency Medicine

## 2017-11-26 DIAGNOSIS — R51 Headache: Secondary | ICD-10-CM | POA: Diagnosis present

## 2017-11-26 DIAGNOSIS — Z5321 Procedure and treatment not carried out due to patient leaving prior to being seen by health care provider: Secondary | ICD-10-CM | POA: Insufficient documentation

## 2017-11-26 NOTE — Progress Notes (Deleted)
Post Operative Visit  Procedure: Total Laparoscopic hysterectomy with Salpingectomy, Cystoscopy.  Days Post-op: 48  Subjective: ***  Objective: LMP 07/17/2017   EXAM General: {Exam; general:16600} Resp: {Exam; lung:16931} Cardio: {Exam; heart:5510} GI: {Exam, JP:3668159} Extremities: {Exam; extremity:5109} Vaginal Bleeding: {exam; vaginal bleeding:3041122}  Assessment: s/p ***  Plan: Recheck {NUMBER 1-10:22536} weeks ***
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
## 2017-11-27 ENCOUNTER — Encounter: Payer: Self-pay | Admitting: Gastroenterology

## 2017-11-30 ENCOUNTER — Ambulatory Visit: Payer: BLUE CROSS/BLUE SHIELD | Admitting: Gastroenterology

## 2017-12-03 NOTE — Telephone Encounter (Signed)
Rescheduled 6 week post op with Dr. Sabra Heck 12/07/2017.   Encounter closed.

## 2017-12-04 ENCOUNTER — Ambulatory Visit: Payer: BLUE CROSS/BLUE SHIELD | Admitting: Gastroenterology

## 2017-12-07 ENCOUNTER — Encounter: Payer: Self-pay | Admitting: Obstetrics & Gynecology

## 2017-12-07 ENCOUNTER — Ambulatory Visit (INDEPENDENT_AMBULATORY_CARE_PROVIDER_SITE_OTHER): Payer: BLUE CROSS/BLUE SHIELD | Admitting: Obstetrics & Gynecology

## 2017-12-07 VITALS — BP 118/80 | HR 92 | Resp 16 | Ht 65.0 in | Wt 187.0 lb

## 2017-12-07 DIAGNOSIS — Z9889 Other specified postprocedural states: Secondary | ICD-10-CM

## 2017-12-07 DIAGNOSIS — N898 Other specified noninflammatory disorders of vagina: Secondary | ICD-10-CM

## 2017-12-07 NOTE — Patient Instructions (Signed)
Hoy Register
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
## 2017-12-08 LAB — VAGINITIS/VAGINOSIS, DNA PROBE
Candida Species: NEGATIVE
Gardnerella vaginalis: POSITIVE — AB
TRICHOMONAS VAG: NEGATIVE

## 2017-12-10 ENCOUNTER — Telehealth: Payer: Self-pay | Admitting: *Deleted

## 2017-12-10 MED ORDER — METRONIDAZOLE 0.75 % VA GEL: 1.0000 | Freq: Every day | VAGINAL | 0 refills | Status: AC

## 2017-12-10 NOTE — Telephone Encounter (Signed)
Patient notified- Rx sent to pharmacy. 

## 2017-12-10 NOTE — Telephone Encounter (Signed)
LM for pt to call back.

## 2017-12-10 NOTE — Telephone Encounter (Signed)
-----   Message from Megan Salon, MD sent at 12/09/2017  2:55 PM EST ----- Please let pt know her vaginitis testing showed BV.  Ok to treat with Metrogel 3.58%, one applicator QHS x 5 nights OR flagyl 521m bid x 7 days.  If pt chooses oral medication, please advise no ETOH while on medication.  No additional follow up is needed if symptoms resolve.

## 2017-12-10 NOTE — Telephone Encounter (Signed)
Patient returning call to [place].

## 2018-12-02 ENCOUNTER — Ambulatory Visit: Payer: BLUE CROSS/BLUE SHIELD | Admitting: Obstetrics & Gynecology

## 2019-04-24 ENCOUNTER — Ambulatory Visit: Payer: Self-pay

## 2019-04-25 ENCOUNTER — Ambulatory Visit: Payer: 59 | Attending: Internal Medicine

## 2019-04-25 DIAGNOSIS — Z23 Encounter for immunization: Secondary | ICD-10-CM

## 2019-04-25 NOTE — Progress Notes (Signed)
   Covid-19 Vaccination Clinic  Name:  Barbara Poole    MRN: [id number] DOB: Aug 02, 1975  04/25/2019  [name] was observed post Covid-19 immunization for 15 minutes without incident. She was provided with Vaccine Information Sheet and instruction to access the V-Safe system.   [name] was instructed to call [phone] with any severe reactions post vaccine: Marland Kitchen Difficulty breathing  . Swelling of face and throat  . A fast heartbeat  . A bad rash all over body  . Dizziness and weakness   Immunizations Administered    Name Date Dose VIS Date Route   Pfizer COVID-19 Vaccine 04/25/2019 12:26 PM 0.3 mL 01/10/2019 Intramuscular   Manufacturer: [place]   Lot: [id number]   [place]: [address]-9

## 2019-05-16 ENCOUNTER — Encounter (HOSPITAL_BASED_OUTPATIENT_CLINIC_OR_DEPARTMENT_OTHER): Payer: Medicaid Other

## 2019-05-20 ENCOUNTER — Ambulatory Visit: Payer: 59

## 2020-06-13 IMAGING — US US PELVIS COMPLETE TRANSABD/TRANSVAG
1 series · 15 of 25 positions shown · non-contrast
Comparison: 10/18/2009

CLINICAL DATA: Menorrhagia. Fibroids. Previous bilateral tubal
ligation. Gravida 6 para 5. IUD removed on 07/31/2017. Bleeding has
not stopped since IUD removal.

EXAM:
TRANSABDOMINAL AND TRANSVAGINAL ULTRASOUND OF PELVIS
TECHNIQUE: Both transabdominal and transvaginal ultrasound examinations of the
pelvis were performed. Transabdominal technique was performed for
global imaging of the pelvis including uterus, ovaries, adnexal
regions, and pelvic cul-de-sac. It was necessary to proceed with
endovaginal exam following the transabdominal exam to visualize the
ovaries and endometrium.

[Series 1: us pelvis complete transabd/transvag · 15 of 63 slices shown]
[im 1/63]
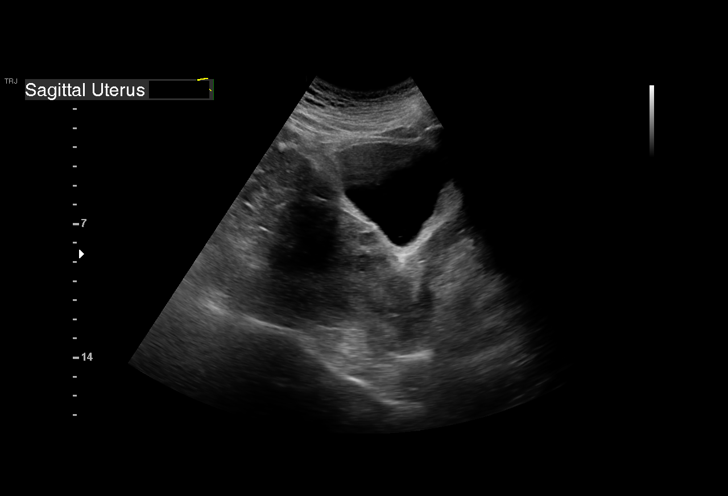
[im 6/63]
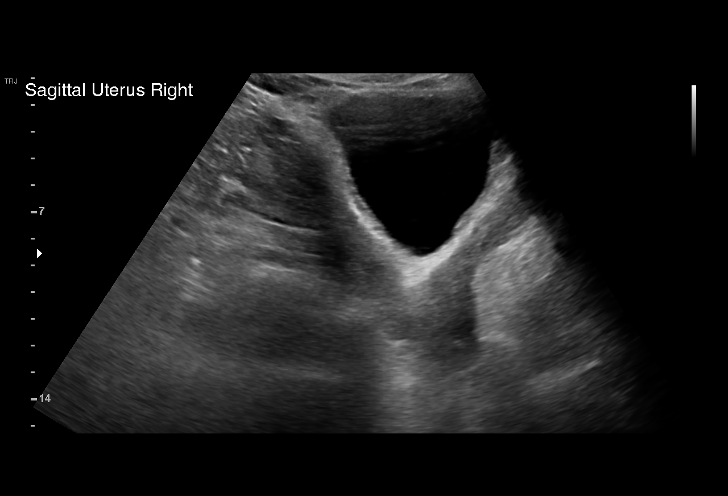
[im 11/63]
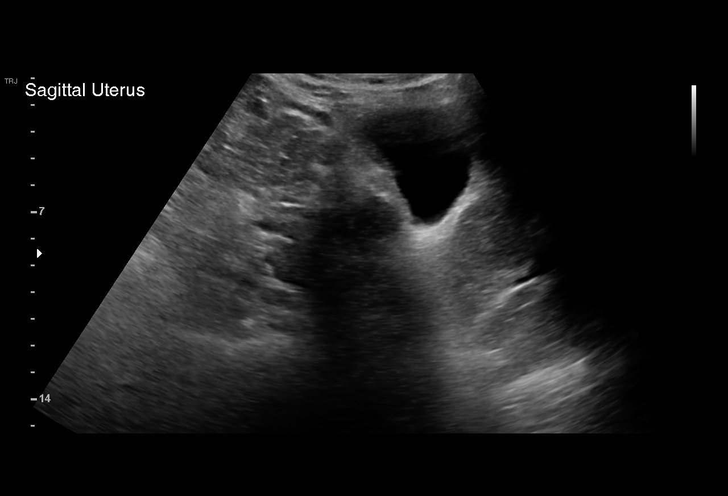
[im 13/63]
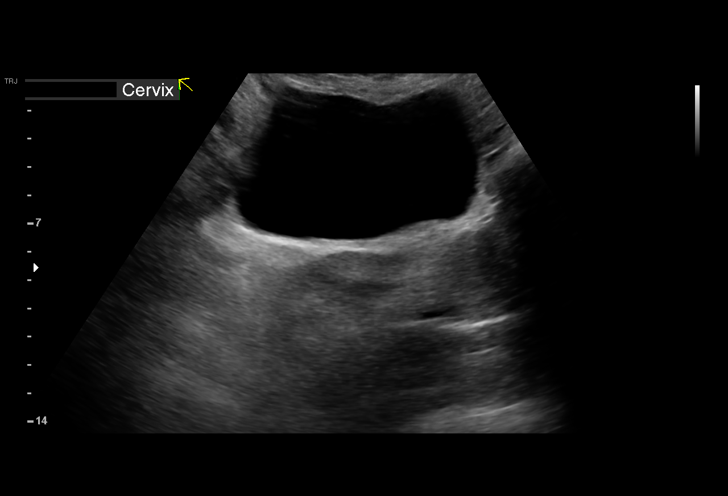
[im 19/63]
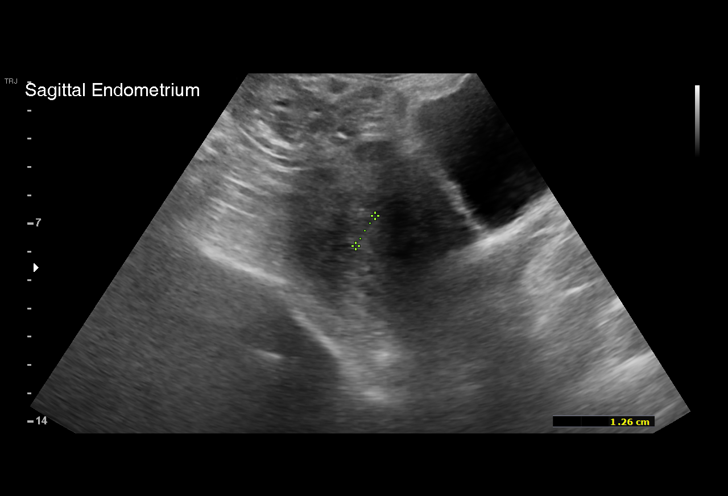
[im 24/63]
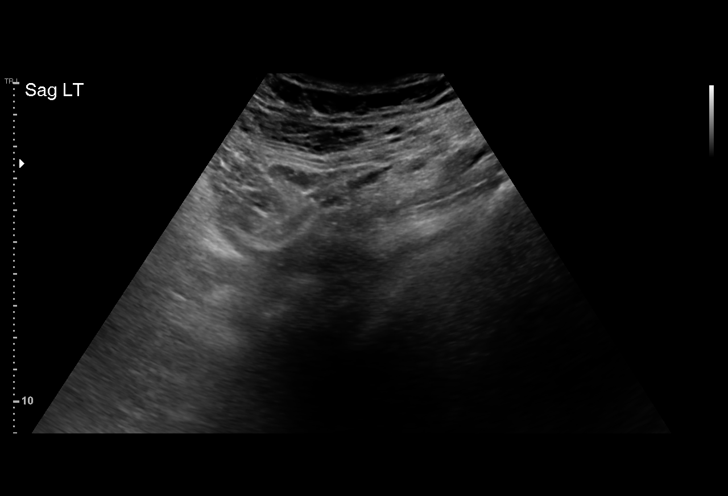
[im 26/63]
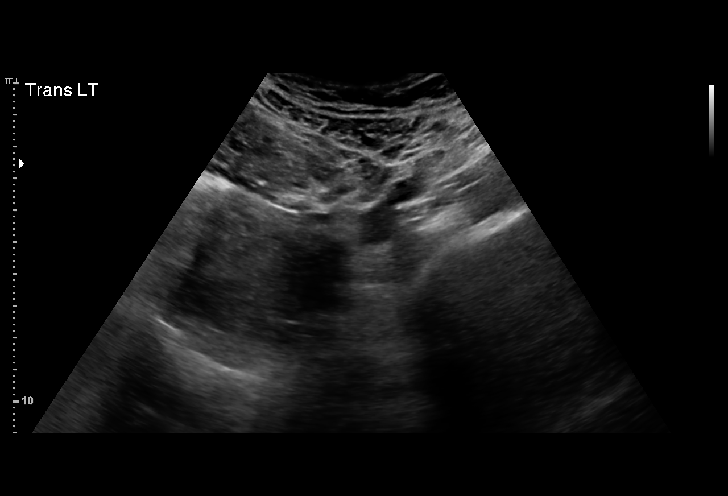
[im 32/63]
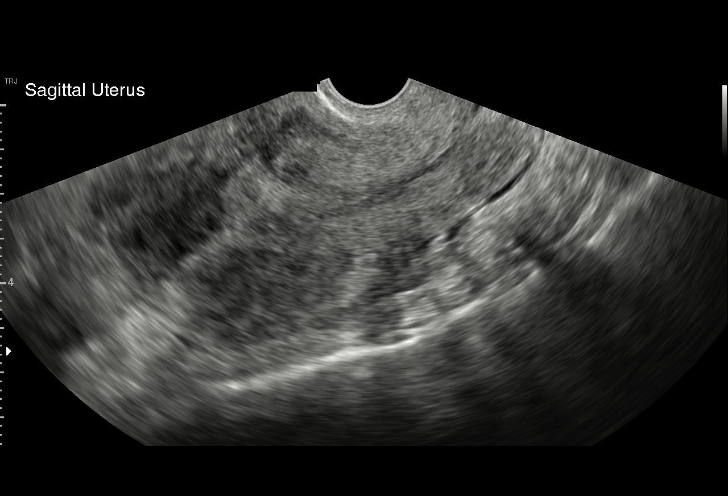
[im 37/63]
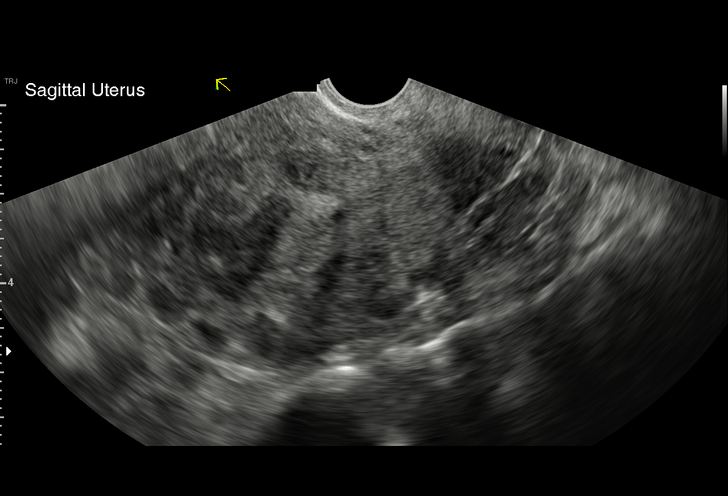
[im 39/63]
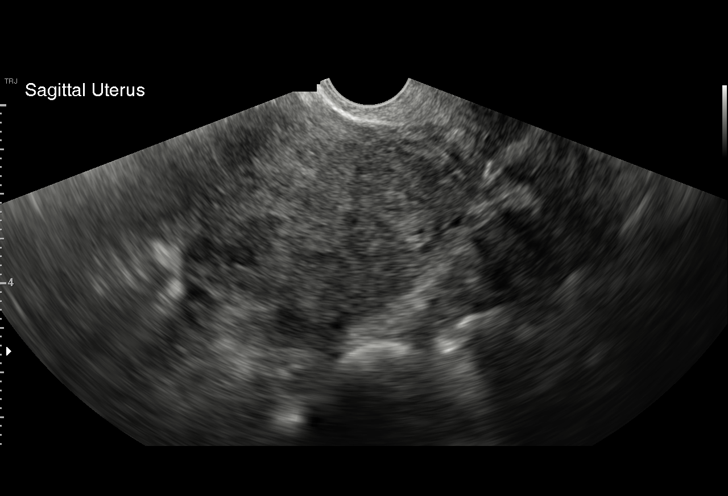
[im 44/63]
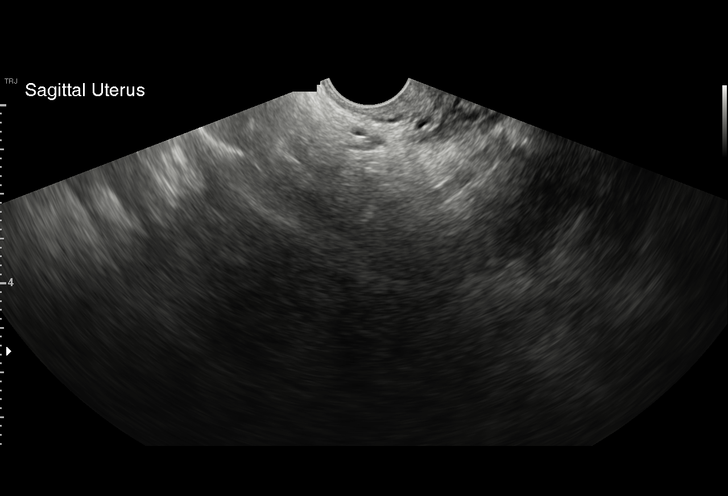
[im 50/63]
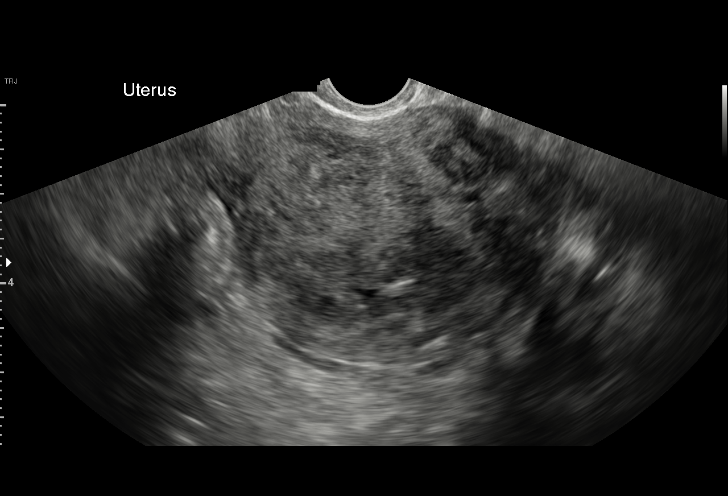
[im 52/63]
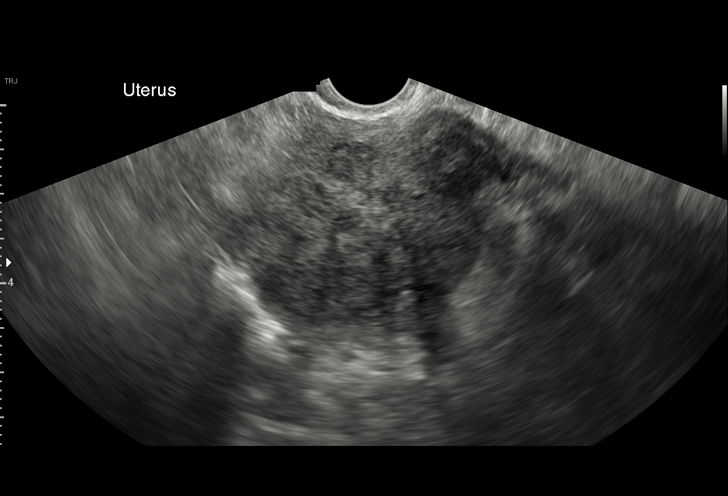
[im 57/63]
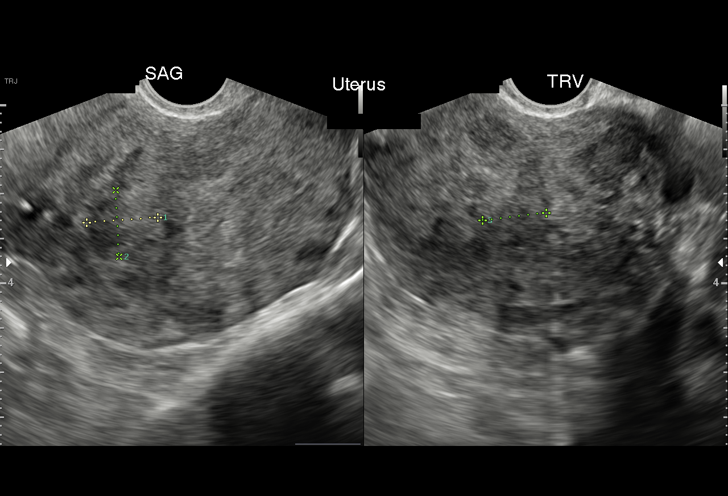
[im 63/63]
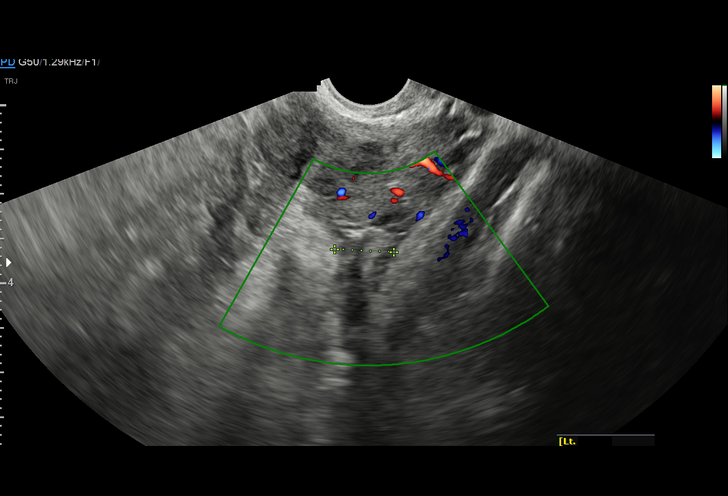

[15 of 25 positions shown; findings below may reference images not displayed]

FINDINGS: Uterus

Measurements: At least 9.5 x 5.0 x 8.1 centimeters. Multiple
fibroids are present. Anterior myometrial fibroid is 1.6 x 1.5 x
centimeters. Anterior subserosal fibroid is 1.9 x 2.0 x
centimeters LEFT posterior myometrial fibroid is 1.8 x 1.7 x
centimeters.

Endometrium

Thickness: Difficult to evaluate due to multiple fibroids. However
the AP thickness is estimated to be 5.0 millimeters..

Right ovary

Measurements: 3.4 x 1.6 x 1.3 centimeter. Normal appearance/no
adnexal mass.

Left ovary

Measurements: 3.4 x 1.0 x 1.3 centimeters. Normal appearance/no
adnexal mass.

Other findings

No abnormal free fluid.
IMPRESSION: 1. Multiple uterine fibroids, largest measuring 2.5 centimeters.
2. Endometrial evaluation is limited by the presence of fibroids but
the endometrium is probably normal in thickness.
3. If bleeding remains unresponsive to hormonal or medical therapy,
sonohysterogram should be considered for focal lesion work-up. (Ref:
Radiological Reasoning: Algorithmic Workup of Abnormal Vaginal
Bleeding with Endovaginal Sonography and Sonohysterography. AJR
5339; 191:S68-73)
4. No adnexal mass.

## 2023-10-25 ENCOUNTER — Ambulatory Visit

## 2023-10-25 ENCOUNTER — Ambulatory Visit
Admission: RE | Admit: 2023-10-25 | Discharge: 2023-10-25 | Disposition: A | Discharge status: Home / Self Care | Attending: Family Medicine | Admitting: Family Medicine

## 2023-10-25 ENCOUNTER — Ambulatory Visit: Payer: Self-pay

## 2023-10-25 VITALS — BP 129/97 | HR 82 | Temp 98.9°F | Resp 17

## 2023-10-25 DIAGNOSIS — S99922A Unspecified injury of left foot, initial encounter: Secondary | ICD-10-CM | POA: Diagnosis not present

## 2023-10-25 DIAGNOSIS — S99822A Other specified injuries of left foot, initial encounter: Secondary | ICD-10-CM

## 2023-10-25 DIAGNOSIS — M79672 Pain in left foot: Secondary | ICD-10-CM

## 2023-10-25 NOTE — ED Provider Notes (Signed)
 [name]    CSN: [id number] Arrival date & time: 10/25/23  1845      History   Chief Complaint Chief Complaint  Patient presents with   Foot Injury    LT    HPI Barbara Poole is a 48 y.o. female.   HPI 48 year old female presents with left foot pain secondary to slipping off a stool last night.  PMH significant for HTN, migraine, HTN.  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    Past Surgical History:  Procedure Laterality Date   ABDOMINAL HYSTERECTOMY     CYSTOSCOPY N/A 10/09/2017   Procedure: CYSTOSCOPY;  Surgeon: Cleotilde Ronal RAMAN, MD;  Location: [facility];  Service: Gynecology;  Laterality: N/A;  possible cysto   TONSILLECTOMY     TOTAL LAPAROSCOPIC HYSTERECTOMY WITH SALPINGECTOMY Bilateral 10/09/2017   Procedure: TOTAL LAPAROSCOPIC HYSTERECTOMY WITH SALPINGECTOMY;  Surgeon: Cleotilde Ronal RAMAN, MD;  Location: [facility]: [facility];  Service: Gynecology;  Laterality: Bilateral;  need extended recovery bed   TUBAL LIGATION  2008   post partum   WISDOM TOOTH EXTRACTION      OB History     Gravida  6   Para  5   Term  5   Preterm      AB  1   Living  3      SAB      IAB      Ectopic      Multiple      Live Births  3            Home Medications    Prior to Admission medications   Medication Sig Start Date End Date Taking? Authorizing Provider  acyclovir (ZOVIRAX) 400 MG tablet Take 400 mg by mouth 2 (two) times daily.  05/16/16   [provider]  hydrochlorothiazide  (HYDRODIURIL )  12.5 MG tablet Take 12.5 mg by mouth daily.  06/01/17   [provider]  ibuprofen  (ADVIL ,MOTRIN ) 800 MG tablet TAKE 1 TABLET BY MOUTH EVERY 8 HOURS AS NEEDED 11/20/17   Cleotilde Ronal RAMAN, MD  Multiple Vitamin (MULTIVITAMIN WITH MINERALS) TABS tablet Take 1 tablet by mouth daily.    [provider]    Family History Family History  Problem Relation Age of Onset   Hyperlipidemia Father    Hypertension Father    Crohn's disease Son    Diabetes Maternal Uncle    Diabetes Paternal Aunt    Hypertension Maternal Grandmother    Diabetes Paternal Grandmother     Social History Social History   Tobacco Use   Smoking status: Former    Current packs/day: 0.00    Types: Cigarettes    Quit date: 03/10/2003    Years since quitting: 20.6   Smokeless tobacco: Never  Vaping Use   Vaping status: Never Used  Substance Use Topics   Alcohol use: Yes    Alcohol/week: 1.0 standard drink of alcohol    Types: 1 Standard drinks or equivalent  per week    Comment: weekly   Drug use: Yes    Types: Marijuana    Comment: occasional     Allergies   Patient has no known allergies.   Review of Systems Review of Systems  Musculoskeletal:        Left foot pain     Physical Exam Triage Vital Signs ED Triage Vitals  Encounter Vitals Group     BP      Girls Systolic BP Percentile      Girls Diastolic BP Percentile      Boys Systolic BP Percentile      Boys Diastolic BP Percentile      Pulse      Resp      Temp      Temp src      SpO2      Weight      Height      Head Circumference      Peak Flow      Pain Score      Pain Loc      Pain Education      Exclude from Growth Chart    No data found.  Updated Vital Signs BP (!) 129/97 (BP Location: Right Arm)   Pulse 82   Temp 98.9 F (37.2 C) (Oral)   Resp 17   LMP 07/17/2017   SpO2 97%    Physical Exam Vitals and nursing note reviewed.  Constitutional:      Appearance: Normal appearance. She is normal weight.   HENT:     Head: Normocephalic and atraumatic.     Mouth/Throat:     Mouth: Mucous membranes are moist.     Pharynx: Oropharynx is clear.  Eyes:     Extraocular Movements: Extraocular movements intact.     Conjunctiva/sclera: Conjunctivae normal.     Pupils: Pupils are equal, round, and reactive to light.  Cardiovascular:     Rate and Rhythm: Normal rate and regular rhythm.     Pulses: Normal pulses.     Heart sounds: Normal heart sounds.  Pulmonary:     Effort: Pulmonary effort is normal.     Breath sounds: Normal breath sounds. No wheezing, rhonchi or rales.  Musculoskeletal:        General: Normal range of motion.     Comments: Left foot (lateral aspects): Moderate soft tissue swelling noted  Skin:    General: Skin is warm and dry.  Neurological:     General: No focal deficit present.     Mental Status: She is alert and oriented to person, place, and time. Mental status is at baseline.  Psychiatric:        Mood and Affect: Mood normal.        Behavior: Behavior normal.      UC Treatments / Results  Labs (all labs ordered are listed, but only abnormal results are displayed) Labs Reviewed - No data to display  EKG   Radiology DG Foot Complete Left Result Date: 10/25/2023 CLINICAL DATA:  Injury, pain of the base of the fifth metatarsal EXAM: LEFT FOOT - COMPLETE 3+ VIEW COMPARISON:  None Available. FINDINGS: No acute fracture or dislocation. There is no evidence of arthropathy or other focal bone abnormality. Soft tissues are unremarkable. No radiopaque foreign body. IMPRESSION: No acute fracture or dislocation. Electronically Signed   By: Rogelia Myers M.D.   On: 10/25/2023 19:17    Procedures Procedures (including critical care time)  Medications Ordered in UC  Medications - No data to display  Initial Impression / Assessment and Plan / UC Course  I have reviewed the triage vital signs and the nursing notes.  Pertinent labs & imaging results that were  available during my care of the patient were reviewed by me and considered in my medical decision making (see chart for details).     MDM: 1.  Acute foot pain, left-left foot x-ray revealed above, patient advised; 2.  Injury of left foot, initial encounter-Ace wrap placed on left foot prior to discharge today.  Advised patient of left foot x-ray results with hardcopy provided.  Advised patient to RICE affected area of left foot for 30 minutes 3 times daily for the next 3 days.  Advised may use OTC Ibuprofen  800 mg daily, as needed for left foot pain.  Advised if symptoms worsen and/or unresolved please follow-up with your PCP or [facility] orthopedics for further evaluation.  Patient discharged home, hemodynamically stable. Final Clinical Impressions(s) / UC Diagnoses   Final diagnoses:  Acute foot pain, left  Injury of left foot, initial encounter     Discharge Instructions      Advised patient of left foot x-ray results with hardcopy provided.  Advised patient to RICE affected area of left foot for 30 minutes 3 times daily for the next 3 days.  Advised may use OTC Ibuprofen  800 mg daily, as needed for left foot pain.  Advised if symptoms worsen and/or unresolved please follow-up with your PCP or [facility] orthopedics for further evaluation.     ED Prescriptions   None    PDMP not reviewed this encounter.   Teddy Sharper, FNP 10/25/23 1943

## 2023-10-25 NOTE — ED Triage Notes (Signed)
 Pt c/o LT foot pain since 8p last night. Slipped off stool landing on high heel causing foot to roll awkwardly. Swelling visible. Tylenol  prn.

## 2023-10-25 NOTE — Discharge Instructions (Addendum)
 Advised patient of left foot x-ray results with hardcopy provided.  Advised patient to RICE affected area of left foot for 30 minutes 3 times daily for the next 3 days.  Advised may use OTC Ibuprofen  800 mg daily, as needed for left foot pain.  Advised if symptoms worsen and/or unresolved please follow-up with your PCP or [facility] orthopedics for further evaluation.
